# Patient Record
Sex: Male | Born: 1950 | Race: White | Hispanic: No | Marital: Married | Smoking: Never smoker
Health system: Southern US, Community
[De-identification: ages and names within clinical notes are randomized; demographics above are authoritative.]

## PROBLEM LIST (undated history)

## (undated) DIAGNOSIS — E785 Hyperlipidemia, unspecified: Secondary | ICD-10-CM

## (undated) DIAGNOSIS — N400 Enlarged prostate without lower urinary tract symptoms: Secondary | ICD-10-CM

## (undated) DIAGNOSIS — G629 Polyneuropathy, unspecified: Secondary | ICD-10-CM

## (undated) HISTORY — DX: Polyneuropathy, unspecified: G62.9

## (undated) HISTORY — DX: Benign prostatic hyperplasia without lower urinary tract symptoms: N40.0

## (undated) HISTORY — DX: Hyperlipidemia, unspecified: E78.5

## (undated) HISTORY — PX: ROTATOR CUFF REPAIR: SHX139

## (undated) HISTORY — PX: TONSILLECTOMY: SUR1361

---

## 2014-07-30 ENCOUNTER — Ambulatory Visit: Admit: 2014-07-30 | Discharge: 2014-07-30 | Payer: MEDICARE | Attending: Urology | Primary: General Practice

## 2014-07-30 DIAGNOSIS — N289 Disorder of kidney and ureter, unspecified: Secondary | ICD-10-CM

## 2014-07-30 LAB — AMB POC URINALYSIS DIP STICK AUTO W/O MICRO
Blood (UA POC): NEGATIVE
Glucose (UA POC): NEGATIVE
Leukocyte esterase (UA POC): NEGATIVE
Nitrites (UA POC): NEGATIVE
Specific gravity (UA POC): 1.03 (ref 1.001–1.035)
Urobilinogen (UA POC): 4 (ref 0.2–1)
pH (UA POC): 5.5 (ref 4.6–8.0)

## 2014-07-30 NOTE — Progress Notes (Signed)
ASSESSMENT:     ICD-10-CM ICD-9-CM    1. Renal lesion N28.9 593.9 AMB POC URINALYSIS DIP STICK AUTO W/O MICRO      CT ABD W WO CONT      1. Patient is a 64 y.o. Caucasian male with 33mm exophytic isodense right renal lesion, potential hemorrhagic cyst or solid mass on CT A/P 07/20/14  2. Transaminitis  3. Hepatic steatosis    PLAN:    CT images reviewed and report given to patient   Primary recommendation is for active surveillance  Instructed patient to limit use of NSAIDS  Recommend staying well hydrated  F/u in 4 months with imaging prior  CT ABD WWO ordered    DISCUSSION:   A solid renal mass raises the suspicion of primary renal malignancy. We discussed this in detail and in regards to the spectrum of renal masses which includes cysts (pure cysts are considered benign), solid masses and everything in between. The risk of metastasis increases as the size of solid renal mass increases. In general, it is believed that the risk of metastasis for renal masses less than 3-4 cm is small (up to approximately 5%) based mainly on large retrospective studies. In some cases and especially in patients of older age and multiple comorbidities a surveillance approach may be appropriate. The treatment of solid renal masses includes: surveillance, cryoablation (percutaneous and laparoscopic) in addition to partial and complete nephrectomy (each with option of laparoscopic, robotic and open depending on appropriateness). Furthermore, nephrectomy appears to be an independent risk factor for the development of chronic kidney disease suggesting that nephron sparing approaches should be implored whenever feasible. We reviewed these options in context of the patients current situation as well as the pros and cons of each.    For cystic renal masses, we reviewed the Bosniak classification and discussed that Bosniak 3 lesions harbor a 50% chance of malignancy whereas  Bosniak 4 cysts have a solid and 90-95% are malignant in nature.      Chief Complaint   Patient presents with   ??? Other     renal lesion       HISTORY OF PRESENT ILLNESS:  Gary Keith is a 64 y.o. Caucasian male who presents today for consultation for a renal mass and has been referred by Dr. Michae Kava.   Patient presented today reporting he was taking Oxycodone for 2 years and decided to come off it. He went to PCP and was switched to Tramadol. Patient reports taking Acetaminophen 800mg .   Patient presented to ED for elevated enzymes per his PCP. Presented to ED on 07/20/14 c/o RUQ pain that radiated to the back and up and down right lateral side    Patients presents today c/o right sided back pain.   He is actually not sure of how long the pain has been there, believes his chronic pain medication may have masked the pain for some years.     CT Abd Pelv 07/20/14 demonstrated no acute radiographic abnormality to explain the patient's abdominal pain. Hepatic steatosis. 9 mm exophytic isodense right renal lesion, potential hemorrhagic cyst or solid mass.     Patient denies nausea and vomiting.  Patient reports some constipation.   Patient denies anorexia or decreased appetite  Patient has been actively trying to lose weight has dropped approx 10lbs in the last few months    Patient denies any urinary difficulties.  He specifically denies gross hematuria, dysuria.     Patient has h/o diverticulitis, last colonoscopy  was 10 years ago, has had no major issues since then  Patient denies drinking alcohol and reports a negative smoking history.     Review of Systems  Constitutional: Fever: No  Skin: Rash: No  HEENT: Hearing difficulty: No  Eyes: Blurred vision: No  Cardiovascular: Chest pain: No  Respiratory: Shortness of breath: No  Gastrointestinal: Nausea/vomiting: No  Musculoskeletal: Back pain: No  Neurological: Weakness: No  Psychological: Memory loss: No  Comments/additional findings:      History reviewed. No pertinent past medical history.    History reviewed. No pertinent past surgical history.    History   Substance Use Topics   ??? Smoking status: Never Smoker    ??? Smokeless tobacco: Not on file   ??? Alcohol Use: No       No Known Allergies    History reviewed. No pertinent family history.    Current Outpatient Prescriptions   Medication Sig Dispense Refill   ??? TRAMADOL HCL (TRAMADOL PO) Take  by mouth.     ??? TIZANIDINE HCL (TIZANIDINE PO) Take  by mouth.     ??? citalopram (CELEXA) 20 mg tablet Take  by mouth daily.     ??? gabapentin (NEURONTIN) 600 mg tablet Take  by mouth three (3) times daily.     ??? DIPHENHYDRAMINE HCL (BENADRYL ALLERGY PO) Take  by mouth.     ??? tamsulosin (FLOMAX) 0.4 mg capsule Take 0.4 mg by mouth daily.           PHYSICAL EXAMINATION:   BP 120/70 mmHg   Ht $R'5\' 6"'QV$  (1.676 m)   Wt 206 lb (93.441 kg)   BMI 33.27 kg/m2  Constitutional: WDWN, Pleasant and appropriate affect, No acute distress.    CV:  No peripheral swelling noted  Respiratory: No respiratory distress or difficulties  Abdomen:  No abdominal masses or tenderness. No CVA tenderness. No inguinal hernias noted.   GU Male:  Deferred  Skin: No jaundice.    Neuro/Psych:  Alert and oriented x 3, affect appropriate.   Lymphatic:   No enlarged inguinal lymph nodes.        REVIEW OF LABS AND IMAGING:    Results for orders placed or performed in visit on 07/30/14   AMB POC URINALYSIS DIP STICK AUTO W/O MICRO   Result Value Ref Range    Color (UA POC) Yellow     Clarity (UA POC) Clear     Glucose (UA POC) Negative Negative    Bilirubin (UA POC) 1+ Negative    Ketones (UA POC) Trace Negative    Specific gravity (UA POC) 1.030 1.001 - 1.035    Blood (UA POC) Negative Negative    pH (UA POC) 5.5 4.6 - 8.0    Protein (UA POC) Trace Negative mg/dL    Urobilinogen (UA POC) 4 mg/dL 0.2 - 1    Nitrites (UA POC) Negative Negative    Leukocyte esterase (UA POC) Negative Negative       Imaging Report Reviewed?  YES   Type:  CT scan   Images Reviewed?      YES      Type:   CT scan     Other Lab Data Reviewed?   YES    Urinalysis, CBC, BMP, Lipase, Hepatic function panel     CT ABD PELV 07/20/14  FINDINGS:  LOWER CHEST: There is a linear area of subsegmental atelectasis or scarring in the right lower lobe.  LIVER, BILIARY: There is diffuse decreased attenuation of  the hepatic parenchyma consistent with steatosis. No biliary dilation. Gallbladder is contracted.  PANCREAS: Normal.  SPLEEN: Normal.  ADRENALS: Normal.  KIDNEYS/URETERS/BLADDER: There is a 9 mm exophytic isodense lesion within the interpolar right kidney. No hydroureteronephrosis. Normal bladder.  PELVIC ORGANS: Unremarkable.  VASCULATURE: Unremarkable.  LYMPH NODES: No enlarged lymph nodes.  GASTROINTESTINAL TRACT: No bowel dilation or wall thickening. Appendix is not visualized. No secondary findings to suggest acute appendicitis.  BONES: No acute or aggressive osseous abnormalities identified. ??  OTHER: None.    IMPRESSION:  1. No acute radiographic abnormality to explain the patient's abdominal pain.  2. Hepatic steatosis.  3. 9 mm exophytic isodense right renal lesion, potential hemorrhagic cyst or solid mass. Consider routine outpatient abdominal CT, renal mass protocol, for initial evaluation.    BMP 07/20/14     Ref Range 07/20/14 ??6:20 PM     POTASSIUM 3.5-5.5 mmol/L 4.6   SODIUM 133-145 mmol/L 141   CHLORIDE 98-110 mmol/L 102   GLUCOSE 65-99 mg/dL 124 (H)   CALCIUM 8.4-10.4 mg/dL 8.6   BUN 6-22 mg/dL 11   CREATININE 0.8-1.6 mg/dL 0.9   CO2 20-32 mmol/L 28   eGFR African American >60.0  >60.0   eGFR Non African American >60.0  >60.0   ANION GAP mmol/L 10.8     Lipase 07/20/14     Ref Range 07/20/14 ??6:20 PM     LIPASE 7-60 U/L 29     Hepatic function panel 07/20/14     Ref Range 07/20/14 ??6:20 PM     ALBUMIN 3.5-5.0 g/dL 4.2   TOTAL PROTEIN 6.2-8.1 g/dL 6.9   GLOBULIN SERUM 2.0-4.0 g/dL 2.7   A/G RATIO 1.1-2.6 ratio 1.6   BILIRUBIN TOTAL 0.2-1.2 mg/dL 0.3    BILIRUBIN DIRECT 0.0-0.3 mg/dL <0.2   SGOT (AST) 10-37 U/L 156 (H)   ALKALINE PHOSPHATASE 40-125 U/L 82   SGPT (ALT) 5-40 U/L 543 (H)     CBC 07/20/14     Ref Range 07/20/14 ??6:20 PM     WBC x 10*3 4.0-11.0 K/uL 8.6   RBC x 10^6 3.80-5.80 M/uL 4.80   HGB 13.1-17.2 g/dL 13.9   HCT 39.3-51.6 % 41.5   MCV 80-95 fL 87   MCH 26-34 pg 29   MCHC 32-36 g/dL 34   RDW 10.0-16.0 % 13.0   PLATELET 140-440 K/uL 240   MPV 6.0-10.8 fL 10.6   SEGMENTED NEUTROPHILS 40-75 % 49   LYMPHOCYTES 27-45 % 32   MONOCYTES 3-9 % 9   EOSINOPHIL 0-6 % 9 (H)   BASOPHILS 0-2 % 1   ABSOLUTE NEUTROPHILS 1.8-7.7 K/uL 4.3   ABSOLUTE LYMPHOCYTES 1.0-4.8 K/uL 2.8   ABSOLUTE MONOCYTE COUNT 0.1-0.9 K/uL 0.8   ABSOLUTE EOSINOPHIL 0.0-0.5 K/uL 0.8 (H)   ABSOLUTE BASOPHIL COUNT 0.0-0.2 K/uL 0.1           A copy of today's office visit with all pertinent imaging results and labs were sent to the referring physician, Dr. Amanda Pea, MD       Medical documentation is provided with the assistance of Nikki L. Yolanda Bonine, medical scribe for Shirlee Latch, MD

## 2014-08-05 NOTE — Telephone Encounter (Signed)
Pts wife called office stating that pt has a small kidney mass and LFT came back and she would like to discuss results with Dr Daphine DeutscherMartin.  Pts wife was informed that Dr Daphine DeutscherMartin is out of the office today, but a message would be sent to her.  Pts wife may be contacted at [phone].

## 2014-08-09 NOTE — Progress Notes (Signed)
Faxed patients orders to [facility] for scheduling in June prior to f/u scheduled for Friday, December 03, 2014 11:00 AM

## 2014-08-19 LAB — HEPATIC FUNCTION PANEL
A-G Ratio: 1.3 ratio (ref 1.1–2.6)
ALT (SGPT): 170 U/L — ABNORMAL HIGH (ref 5–40)
AST (SGOT): 47 U/L — ABNORMAL HIGH (ref 10–37)
Albumin: 4 g/dL (ref 3.5–5.0)
Alk. phosphatase: 93 U/L (ref 40–125)
Bilirubin, direct: 0.2 mg/dL (ref 0.0–0.3)
Bilirubin, total: 1 mg/dL (ref 0.2–1.2)
Globulin: 3.1 g/dL (ref 2.0–4.0)
Protein, total: 7.1 g/dL (ref 6.2–8.1)

## 2014-09-15 ENCOUNTER — Encounter: Attending: Urology | Primary: General Practice

## 2014-09-17 ENCOUNTER — Ambulatory Visit: Admit: 2014-09-17 | Discharge: 2014-09-17 | Payer: MEDICARE | Attending: Urology | Primary: General Practice

## 2014-09-17 DIAGNOSIS — N289 Disorder of kidney and ureter, unspecified: Secondary | ICD-10-CM

## 2014-09-17 LAB — AMB POC URINALYSIS DIP STICK AUTO W/O MICRO
Bilirubin (UA POC): NEGATIVE
Blood (UA POC): NEGATIVE
Glucose (UA POC): NEGATIVE
Ketones (UA POC): NEGATIVE
Leukocyte esterase (UA POC): NEGATIVE
Nitrites (UA POC): NEGATIVE
Protein (UA POC): NEGATIVE mg/dL
Specific gravity (UA POC): 1.02 (ref 1.001–1.035)
Urobilinogen (UA POC): 0.2 (ref 0.2–1)
pH (UA POC): 6.5 (ref 4.6–8.0)

## 2014-09-17 MED ORDER — TADALAFIL 5 MG TABLET
5 mg | ORAL_TABLET | Freq: Every day | ORAL | Status: AC | PRN
Start: 2014-09-17 — End: ?

## 2014-09-17 NOTE — Progress Notes (Signed)
ASSESSMENT:     ICD-10-CM ICD-9-CM    1. Renal lesion N28.9 593.9 AMB POC URINALYSIS DIP STICK AUTO W/O MICRO   2. Benign non-nodular prostatic hyperplasia with lower urinary tract symptoms N40.1 600.91 tadalafil (CIALIS) 5 mg tablet   3. Nocturia R35.1 788.43 tadalafil (CIALIS) 5 mg tablet      1. Patient is a 64 y.o. Caucasian male with 59mm exophytic isodense right renal lesion, potential hemorrhagic cyst or solid mass on CT A/P 07/20/14  2. Transaminitis  3. Hepatic steatosis  4. BPH/Nocturia     PLAN:    Schedule for right robotic-assisted laparoscopic partial nephrectomy with intraoperative renal US  My primary recommendation is surveillance, patient wishes to have surgery   Discussed cryoablation in detail with patient  CT reviewed in detail with patient   Cialis 5mg  rx given for BPH sxs and failed flomax      DISCUSSION:   Pre-operative Counseling Note for Partial Nephrectomy:    Dorsie Burich and I discussed the risks of the proposed partial nephrectomy.  I indicated that the risks include but are not limited to:  Infection, hemorrhage, possible vascular injury to all or a segment of the non-cancerous portion of the kidney, ureteral injury, urine leak, urine fistula, kidney cancer recurrence, pneumothorax with possible need for chest tube, bowel injury, flank bulge, incisional hernia, deep vein thrombosis, pulmonary embolis.  Aris Everts asked and I answered questions relating to these risks, possible benefits and alternatives to treatment inlcuding no treatment.      All questions have been answered to their level of satisfaction and they have agreed to proceed.    Chief Complaint   Patient presents with   ??? Renal Mass     Right       HISTORY OF PRESENT ILLNESS:  Gary Keith is a 64 y.o. Caucasian male who presents today in follow up for a renal mass.   Patient initially presented reporting he was taking Oxycodone for 2 years  and decided to come off it. He went to PCP and was switched to Tramadol. Reported that he was currently taking Acetaminophen 800mg .   Patient presented to ED for elevated enzymes per his PCP.   Presented to ED on 07/20/14 c/o RUQ pain that radiated to the back and up and down right lateral side    Patients presents today to discuss surgical options for his renal mass. He does not wish to surveillance the mass.   CT Abd Pelv 07/20/14 demonstrated no acute radiographic abnormality to explain the patient's abdominal pain. Hepatic steatosis. 9 mm exophytic isodense right renal lesion, potential hemorrhagic cyst or solid mass.     Patient denies nausea, vomiting, chills or fevers.   Patient denies anorexia or decreased appetite    Patient reports nocturia x2-3.   He specifically denies gross hematuria, dysuria.   He is currently on Flomax 0.4mg  without much benefit.     Patient denies heart or lung issues. Denies breathing issues.   Reports ability to walk up a flight of stairs.     Patient reports appendix surgery as a baby.  Patient has h/o diverticulitis, last colonoscopy was 10 years ago, has had no major issues since then  Patient denies drinking alcohol and reports a negative smoking history.     Patient reports his daughter is being married in June and he would like to be ready for her wedding.     Review of Systems  Constitutional: Fever: No  Skin: Rash: No  HEENT: Hearing difficulty: No  Eyes: Blurred vision: No  Cardiovascular: Chest pain: No  Respiratory: Shortness of breath: No  Gastrointestinal: Nausea/vomiting: No  Musculoskeletal: Back pain: Yes  Neurological: Weakness: No  right side, constant but controlled with medications  Psychological: Memory loss: No  Comments/additional findings:     Past Medical History   Diagnosis Date   ??? Kidney disease        History reviewed. No pertinent past surgical history.    History   Substance Use Topics   ??? Smoking status: Never Smoker     ??? Smokeless tobacco: Not on file   ??? Alcohol Use: No       No Known Allergies    History reviewed. No pertinent family history.    Current Outpatient Prescriptions   Medication Sig Dispense Refill   ??? tadalafil (CIALIS) 5 mg tablet Take 1 Tab by mouth daily as needed. 30 Tab 11   ??? TRAMADOL HCL (TRAMADOL PO) Take  by mouth.     ??? citalopram (CELEXA) 20 mg tablet Take  by mouth daily.     ??? gabapentin (NEURONTIN) 600 mg tablet Take  by mouth three (3) times daily.     ??? DIPHENHYDRAMINE HCL (BENADRYL ALLERGY PO) Take  by mouth.     ??? tamsulosin (FLOMAX) 0.4 mg capsule Take 0.4 mg by mouth daily.           PHYSICAL EXAMINATION:   BP 122/68 mmHg   Ht $R'5\' 6"'KI$  (1.676 m)   Wt 206 lb (93.441 kg)   BMI 33.27 kg/m2  Constitutional: WDWN, Pleasant and appropriate affect, No acute distress.    CV:  No peripheral swelling noted, RRR  Respiratory: No respiratory distress or difficulties, CTA B  Abdomen:  No abdominal masses or tenderness. No CVA tenderness. No inguinal hernias noted.   GU Male:  Deferred  Skin: No jaundice.    Neuro/Psych:  Alert and oriented x 3, affect appropriate.   Lymphatic:   No enlarged inguinal lymph nodes.        REVIEW OF LABS AND IMAGING:    Results for orders placed or performed in visit on 09/17/14   AMB POC URINALYSIS DIP STICK AUTO W/O MICRO   Result Value Ref Range    Color (UA POC) Yellow     Clarity (UA POC) Clear     Glucose (UA POC) Negative Negative    Bilirubin (UA POC) Negative Negative    Ketones (UA POC) Negative Negative    Specific gravity (UA POC) 1.020 1.001 - 1.035    Blood (UA POC) Negative Negative    pH (UA POC) 6.5 4.6 - 8.0    Protein (UA POC) Negative Negative mg/dL    Urobilinogen (UA POC) 0.2 mg/dL 0.2 - 1    Nitrites (UA POC) Negative Negative    Leukocyte esterase (UA POC) Negative Negative       Imaging Report Reviewed?  YES   Type:  CT scan  Images Reviewed?      YES      Type:   CT scan     Other Lab Data Reviewed?   YES    Urinalysis    CT ABD PELV 07/20/14  FINDINGS:   LOWER CHEST: There is a linear area of subsegmental atelectasis or scarring in the right lower lobe.  LIVER, BILIARY: There is diffuse decreased attenuation of the hepatic parenchyma consistent with steatosis. No biliary dilation. Gallbladder is contracted.  PANCREAS: Normal.  SPLEEN: Normal.  ADRENALS: Normal.  KIDNEYS/URETERS/BLADDER: There is a 9 mm exophytic isodense lesion within the interpolar right kidney. No hydroureteronephrosis. Normal bladder.  PELVIC ORGANS: Unremarkable.  VASCULATURE: Unremarkable.  LYMPH NODES: No enlarged lymph nodes.  GASTROINTESTINAL TRACT: No bowel dilation or wall thickening. Appendix is not visualized. No secondary findings to suggest acute appendicitis.  BONES: No acute or aggressive osseous abnormalities identified. ??  OTHER: None.    IMPRESSION:  1. No acute radiographic abnormality to explain the patient's abdominal pain.  2. Hepatic steatosis.  3. 9 mm exophytic isodense right renal lesion, potential hemorrhagic cyst or solid mass. Consider routine outpatient abdominal CT, renal mass protocol, for initial evaluation.    BMP 07/20/14     Ref Range 07/20/14 ??6:20 PM     POTASSIUM 3.5-5.5 mmol/L 4.6   SODIUM 133-145 mmol/L 141   CHLORIDE 98-110 mmol/L 102   GLUCOSE 65-99 mg/dL 254 (H)   CALCIUM 9.8-26.4 mg/dL 8.6   BUN 1-58 mg/dL 11   CREATININE 3.0-9.4 mg/dL 0.9   CO2 07-68 mmol/L 28   eGFR African American >60.0  >60.0   eGFR Non African American >60.0  >60.0   ANION GAP mmol/L 10.8     Lipase 07/20/14     Ref Range 07/20/14 ??6:20 PM     LIPASE 7-60 U/L 29     Hepatic function panel 07/20/14     Ref Range 07/20/14 ??6:20 PM     ALBUMIN 3.5-5.0 g/dL 4.2   TOTAL PROTEIN 0.8-8.1 g/dL 6.9   GLOBULIN SERUM 1.0-3.1 g/dL 2.7   A/G RATIO 5.9-4.5 ratio 1.6   BILIRUBIN TOTAL 0.2-1.2 mg/dL 0.3   BILIRUBIN DIRECT 0.0-0.3 mg/dL <8.5   SGOT (AST) 92-92 U/L 156 (H)   ALKALINE PHOSPHATASE 40-125 U/L 82   SGPT (ALT) 5-40 U/L 543 (H)     CBC 07/20/14     Ref Range 07/20/14 ??6:20 PM      WBC x 10*3 4.0-11.0 K/uL 8.6   RBC x 10^6 3.80-5.80 M/uL 4.80   HGB 13.1-17.2 g/dL 44.6   HCT 28.6-38.1 % 41.5   MCV 80-95 fL 87   MCH 26-34 pg 29   MCHC 32-36 g/dL 34   RDW 77.1-16.5 % 79.0   PLATELET 140-440 K/uL 240   MPV 6.0-10.8 fL 10.6   SEGMENTED NEUTROPHILS 40-75 % 49   LYMPHOCYTES 27-45 % 32   MONOCYTES 3-9 % 9   EOSINOPHIL 0-6 % 9 (H)   BASOPHILS 0-2 % 1   ABSOLUTE NEUTROPHILS 1.8-7.7 K/uL 4.3   ABSOLUTE LYMPHOCYTES 1.0-4.8 K/uL 2.8   ABSOLUTE MONOCYTE COUNT 0.1-0.9 K/uL 0.8   ABSOLUTE EOSINOPHIL 0.0-0.5 K/uL 0.8 (H)   ABSOLUTE BASOPHIL COUNT 0.0-0.2 K/uL 0.1       A copy of today's office visit with all pertinent imaging results and labs were sent to the referring physician, Dr. Elisha Ponder, MD       Brandy Hale. Daphine Deutscher, M.D., [facility]   Urological Oncologist   [facility]   [address]   [place], [place] [address]   [phone]       Medical documentation is provided with the assistance of Nikki L. Bing Plume, medical scribe for Francella Solian, MD

## 2014-09-29 NOTE — Telephone Encounter (Addendum)
Mr. Gary Keith called with question regarding [facility]. He is questioning moving forward with cryoblation.  I informed him that Dr. Daphine DeutscherMartin is out of the office for the week but I would message her an find out what we need to do to set him up.    Per Dr. Daphine DeutscherMartin a referral has been ordered and faxed to Dr. Everardo BealsVinghan for Mr. Gary Keith to discuss cryoblation.  Patient surgery has been scheduled. Appts and post op appts have been cancelled. Next f/u is Friday, December 03, 2014 11:00 AM with CT scan prior.  Imaging has been faxed to [facility] for auth and scheduling in late may early June.

## 2014-10-01 NOTE — Addendum Note (Signed)
Addended by: Mady GemmaLOCKHART, Maleek Craver A on: 10/01/2014 03:50 PM      Modules accepted: Orders

## 2014-10-04 NOTE — Telephone Encounter (Signed)
Pt called to state that he would not be able to come in this week for his F/U w/ Dr. Lalla BrothersLambert. He would like to R/S.    Due to [employer] phone systems down, I was unable to R/S at time of call. [facility] to [facility]/S.

## 2014-10-05 NOTE — Telephone Encounter (Signed)
I spoke with Mr. Gary Keith.  He wanted to be sure his appointment was cancelled with Dr. Daphine DeutscherMartin for post op now that we have cancelled his surgery.  Pt asked why the surgery could not remain on.  I advised him at this time he is unsure if he wants to proceed and wants to see Dr. Everardo BealsVinghan for Cryoblation.  Referral has been sent to Dr. Everardo BealsVinghan for consultation.  I informed the patient after meeting with Dr. Everardo BealsVinghan he should contact us if he wishes to proceed with surgery.  As for now he is scheduled for f/u Friday, December 03, 2014 11:00 AM .

## 2014-10-25 ENCOUNTER — Encounter: Attending: Urology | Primary: General Practice

## 2014-12-03 ENCOUNTER — Encounter: Attending: Urology | Primary: General Practice

## 2017-03-26 DIAGNOSIS — G4733 Obstructive sleep apnea (adult) (pediatric): Secondary | ICD-10-CM | POA: Diagnosis not present

## 2017-04-10 ENCOUNTER — Encounter: Payer: Self-pay | Admitting: Family Medicine

## 2017-04-10 ENCOUNTER — Ambulatory Visit (INDEPENDENT_AMBULATORY_CARE_PROVIDER_SITE_OTHER): Payer: Medicare Other | Admitting: Family Medicine

## 2017-04-10 VITALS — BP 138/88 | HR 64 | Ht 66.0 in | Wt 207.0 lb

## 2017-04-10 DIAGNOSIS — G609 Hereditary and idiopathic neuropathy, unspecified: Secondary | ICD-10-CM

## 2017-04-10 DIAGNOSIS — E669 Obesity, unspecified: Secondary | ICD-10-CM

## 2017-04-10 DIAGNOSIS — F32A Depression, unspecified: Secondary | ICD-10-CM | POA: Insufficient documentation

## 2017-04-10 DIAGNOSIS — N401 Enlarged prostate with lower urinary tract symptoms: Secondary | ICD-10-CM

## 2017-04-10 DIAGNOSIS — R351 Nocturia: Secondary | ICD-10-CM

## 2017-04-10 DIAGNOSIS — E785 Hyperlipidemia, unspecified: Secondary | ICD-10-CM | POA: Diagnosis not present

## 2017-04-10 DIAGNOSIS — F331 Major depressive disorder, recurrent, moderate: Secondary | ICD-10-CM | POA: Diagnosis not present

## 2017-04-10 DIAGNOSIS — D224 Melanocytic nevi of scalp and neck: Secondary | ICD-10-CM

## 2017-04-10 DIAGNOSIS — Q6102 Congenital multiple renal cysts: Secondary | ICD-10-CM

## 2017-04-10 DIAGNOSIS — Z23 Encounter for immunization: Secondary | ICD-10-CM

## 2017-04-10 DIAGNOSIS — F329 Major depressive disorder, single episode, unspecified: Secondary | ICD-10-CM | POA: Insufficient documentation

## 2017-04-10 MED ORDER — CITALOPRAM HYDROBROMIDE 10 MG PO TABS
10.0000 mg | ORAL_TABLET | Freq: Every day | ORAL | 2 refills | Status: DC
Start: 2017-04-10 — End: 2017-05-08

## 2017-04-10 NOTE — Progress Notes (Signed)
Date:  04/10/2017   Name:  Eddie Patterson   DOB:  [birth year]   MRN:  [id number]  PCP:  Adline Potter, MD    Chief Complaint: Establish Care   History of Present Illness:  This is a 66 y.o. male seen for initial visit, recently moved here from [place]. Hx R renal cysts, nephrectomy considered in past. 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 Takes gabapentin for idiopathic neuropathy, works well. HLD on Crestor x 1 year. On Celexa in past for "sweats," admits depression, would like to restart. Lesion on scalp apex for months, getting bigger. Father died suicide, mother died lung ca in 70s, brother died pancreatic ca in 18s, sister with breast cancer. Tet imm 2017, no zoster imm, colonoscopy in August, polyp removed.  Review of Systems:  Review of Systems  Constitutional: Negative for chills and fever.  HENT: Negative for ear pain, sinus pain and trouble swallowing.   Eyes: Negative for pain.  Respiratory: Negative for cough and shortness of breath.   Cardiovascular: Negative for chest pain and leg swelling.  Gastrointestinal: Negative for abdominal pain, constipation and diarrhea.  Genitourinary: Negative for difficulty urinating.  Musculoskeletal: Negative for joint swelling.  Neurological: Negative for syncope and light-headedness.  Hematological: Negative for adenopathy.    Patient Active Problem List   Diagnosis Date Noted  . Obesity (BMI 30-39.9) 04/10/2017  . Hyperlipidemia 04/10/2017  . Depression 04/10/2017  . Multiple renal cysts 04/10/2017    Prior to Admission medications   Medication Sig Start Date End Date Taking? Authorizing Provider  gabapentin (NEURONTIN) 600 MG tablet Take 900 mg by mouth 2 (two) times daily.   Yes [provider]  rosuvastatin (CRESTOR) 5 MG tablet Take 5 mg by mouth daily.   Yes [provider]  tamsulosin (FLOMAX) 0.4 MG CAPS capsule Take 0.8 mg by mouth daily.   Yes [provider]  citalopram (CELEXA)  10 MG tablet Take 1 tablet (10 mg total) by mouth daily. 04/10/17   Adline Potter, MD    No Known Allergies  Past Surgical History:  Procedure Laterality Date  . ROTATOR CUFF REPAIR Left   . TONSILLECTOMY      Social History  Substance Use Topics  . Smoking status: Never Smoker  . Smokeless tobacco: Never Used  . Alcohol use Yes    Family History  Problem Relation Age of Onset  . Cancer Mother   . Diabetes Brother     Medication list has been reviewed and updated.  Physical Examination: BP 138/88   Pulse 64   Ht 5\' 6"  (1.676 m)   Wt 207 lb (93.9 kg)   BMI 33.41 kg/m   Physical Exam  Constitutional: He is oriented to person, place, and time. He appears well-developed and well-nourished.  HENT:  Head: Normocephalic and atraumatic.  Right Ear: External ear normal.  Left Ear: External ear normal.  Nose: Nose normal.  Mouth/Throat: Oropharynx is clear and moist.  TMs clear  Eyes: Pupils are equal, round, and reactive to light. Conjunctivae and EOM are normal.  Neck: Neck supple. No thyromegaly present.  Cardiovascular: Normal rate, regular rhythm, normal heart sounds and intact distal pulses.   Pulmonary/Chest: Effort normal and breath sounds normal.  Abdominal: Soft. He exhibits no distension and no mass. There is no tenderness.  Musculoskeletal: He exhibits no edema.  Lymphadenopathy:    He has no cervical adenopathy.  Neurological: He is alert and oriented to person, place, and time. Coordination normal.  Romberg neg,  gait normal  Skin: Skin is warm and dry.  Irregular nevus scalp apex  Psychiatric: He has a normal mood and affect. His behavior is normal.  Nursing note and vitals reviewed.   Assessment and Plan:  1. Moderate episode of recurrent major depressive disorder (HCC) Restart Celexa 10 mg daily  2. BPH associated with nocturia Increase Flomax to 0.8 mg qhs - Ambulatory referral to Urology  3. Peripheral neuropathy, idiopathic Cont  gabapentin - B12  4. Hyperlipidemia, unspecified hyperlipidemia type On Crestor, unclear indication - Lipid Profile  5. Multiple renal cysts - Ambulatory referral to Urology  6. Atypical nevus of scalp - Ambulatory referral to Dermatology  7. Obesity (BMI 30-39.9) - Comprehensive Metabolic Panel (CMET) - CBC - TSH  8. Need for influenza vaccination - Flu vaccine HIGH DOSE PF (Fluzone High dose)  Return in about 4 weeks (around 05/08/2017).   45 mins spent with pt over half in counseling  Satira Anis. [facility]  04/10/2017

## 2017-04-11 ENCOUNTER — Other Ambulatory Visit: Payer: Self-pay | Admitting: Family Medicine

## 2017-04-11 LAB — COMPREHENSIVE METABOLIC PANEL
A/G RATIO: 2.2 (ref 1.2–2.2)
ALBUMIN: 5 g/dL — AB (ref 3.6–4.8)
ALK PHOS: 66 IU/L (ref 39–117)
ALT: 25 IU/L (ref 0–44)
AST: 20 IU/L (ref 0–40)
BILIRUBIN TOTAL: 0.5 mg/dL (ref 0.0–1.2)
BUN / CREAT RATIO: 16 (ref 10–24)
BUN: 13 mg/dL (ref 8–27)
CO2: 24 mmol/L (ref 20–29)
CREATININE: 0.81 mg/dL (ref 0.76–1.27)
Calcium: 9.5 mg/dL (ref 8.6–10.2)
Chloride: 101 mmol/L (ref 96–106)
GFR calc Af Amer: 109 mL/min/{1.73_m2} (ref >59)
GFR calc non Af Amer: 94 mL/min/{1.73_m2} (ref >59)
GLOBULIN, TOTAL: 2.3 g/dL (ref 1.5–4.5)
Glucose: 93 mg/dL (ref 65–99)
POTASSIUM: 4.3 mmol/L (ref 3.5–5.2)
SODIUM: 141 mmol/L (ref 134–144)
Total Protein: 7.3 g/dL (ref 6.0–8.5)

## 2017-04-11 LAB — CBC
Hematocrit: 45.4 % (ref 37.5–51.0)
Hemoglobin: 15.4 g/dL (ref 13.0–17.7)
MCH: 29.3 pg (ref 26.6–33.0)
MCHC: 33.9 g/dL (ref 31.5–35.7)
MCV: 87 fL (ref 79–97)
PLATELETS: 237 10*3/uL (ref 150–379)
RBC: 5.25 x10E6/uL (ref 4.14–5.80)
RDW: 13.8 % (ref 12.3–15.4)
WBC: 7.2 10*3/uL (ref 3.4–10.8)

## 2017-04-11 LAB — LIPID PANEL
CHOL/HDL RATIO: 4 ratio (ref 0.0–5.0)
Cholesterol, Total: 179 mg/dL (ref 100–199)
HDL: 45 mg/dL (ref >39)
LDL Calculated: 101 mg/dL — ABNORMAL HIGH (ref 0–99)
TRIGLYCERIDES: 165 mg/dL — AB (ref 0–149)
VLDL Cholesterol Cal: 33 mg/dL (ref 5–40)

## 2017-04-11 LAB — VITAMIN B12: VITAMIN B 12: 330 pg/mL (ref 232–1245)

## 2017-04-11 LAB — TSH: TSH: 1.56 u[IU]/mL (ref 0.450–4.500)

## 2017-04-11 MED ORDER — B-12 500 MCG PO TABS: 1.0000 | ORAL_TABLET | Freq: Every day | ORAL | Status: AC

## 2017-04-15 DIAGNOSIS — L570 Actinic keratosis: Secondary | ICD-10-CM | POA: Diagnosis not present

## 2017-04-26 DIAGNOSIS — G4733 Obstructive sleep apnea (adult) (pediatric): Secondary | ICD-10-CM | POA: Diagnosis not present

## 2017-05-02 DIAGNOSIS — H524 Presbyopia: Secondary | ICD-10-CM | POA: Diagnosis not present

## 2017-05-06 DIAGNOSIS — G4733 Obstructive sleep apnea (adult) (pediatric): Secondary | ICD-10-CM | POA: Diagnosis not present

## 2017-05-08 ENCOUNTER — Encounter: Payer: Self-pay | Admitting: Family Medicine

## 2017-05-08 ENCOUNTER — Ambulatory Visit (INDEPENDENT_AMBULATORY_CARE_PROVIDER_SITE_OTHER): Payer: Medicare Other | Admitting: Family Medicine

## 2017-05-08 VITALS — BP 120/78 | HR 68 | Resp 16 | Ht 66.0 in | Wt 204.0 lb

## 2017-05-08 DIAGNOSIS — E538 Deficiency of other specified B group vitamins: Secondary | ICD-10-CM | POA: Diagnosis not present

## 2017-05-08 DIAGNOSIS — F331 Major depressive disorder, recurrent, moderate: Secondary | ICD-10-CM | POA: Diagnosis not present

## 2017-05-08 DIAGNOSIS — N401 Enlarged prostate with lower urinary tract symptoms: Secondary | ICD-10-CM

## 2017-05-08 DIAGNOSIS — D224 Melanocytic nevi of scalp and neck: Secondary | ICD-10-CM | POA: Diagnosis not present

## 2017-05-08 DIAGNOSIS — Q6102 Congenital multiple renal cysts: Secondary | ICD-10-CM

## 2017-05-08 DIAGNOSIS — G609 Hereditary and idiopathic neuropathy, unspecified: Secondary | ICD-10-CM

## 2017-05-08 DIAGNOSIS — E669 Obesity, unspecified: Secondary | ICD-10-CM

## 2017-05-08 DIAGNOSIS — R351 Nocturia: Secondary | ICD-10-CM | POA: Diagnosis not present

## 2017-05-08 MED ORDER — CITALOPRAM HYDROBROMIDE 20 MG PO TABS: 20.0000 mg | ORAL_TABLET | Freq: Every day | ORAL | 3 refills | Status: DC

## 2017-05-08 MED ORDER — GABAPENTIN 600 MG PO TABS: 900.0000 mg | ORAL_TABLET | Freq: Two times a day (BID) | ORAL | 3 refills | Status: DC

## 2017-05-08 MED ORDER — TAMSULOSIN HCL 0.4 MG PO CAPS: 0.8000 mg | ORAL_CAPSULE | Freq: Every day | ORAL | 3 refills | Status: DC

## 2017-05-08 NOTE — Progress Notes (Signed)
Date:  05/08/2017   Name:  Eddie Patterson   DOB:  09-10-1950   MRN:  [id number]  PCP:  Adline Potter, MD    Chief Complaint: Depression (4 week f/u )   History of Present Illness:  This is a 66 y.o. male seen for one month f/u from initial visit. Feels Celexa helping mood and increased Flomax helping nocturia. PN stable on gabapentin. Crestor stopped due to no clear indication. TO see urology next week for renal cysts, saw derm for scalp lesion. Labs showed low normal B12, supplement started given PN. Weight down 3#, exercising at gym 3d/wk.  Review of Systems:  Review of Systems  Constitutional: Negative for chills and fever.  Respiratory: Negative for cough and shortness of breath.   Cardiovascular: Negative for chest pain and leg swelling.  Genitourinary: Negative for difficulty urinating.  Neurological: Negative for syncope and light-headedness.    Patient Active Problem List   Diagnosis Date Noted  . B12 deficiency 05/08/2017  . Obesity (BMI 30-39.9) 04/10/2017  . Hyperlipidemia 04/10/2017  . Depression 04/10/2017  . Multiple renal cysts 04/10/2017  . BPH associated with nocturia 04/10/2017  . Peripheral neuropathy, idiopathic 04/10/2017  . Atypical nevus of scalp 04/10/2017    Prior to Admission medications   Medication Sig Start Date End Date Taking? Authorizing Provider  citalopram (CELEXA) 20 MG tablet Take 1 tablet (20 mg total) daily by mouth. 05/08/17  Yes Jahayra Mazo, Gwyndolyn Saxon, MD  Cyanocobalamin (B-12) 500 MCG TABS Take 1 tablet by mouth daily. 04/11/17  Yes Avenell Sellers, Gwyndolyn Saxon, MD  gabapentin (NEURONTIN) 600 MG tablet Take 1.5 tablets (900 mg total) 2 (two) times daily by mouth. 05/08/17  Yes Roniya Tetro, Gwyndolyn Saxon, MD  tadalafil (CIALIS) 5 MG tablet Take by mouth. 09/17/14  Yes [provider]  tamsulosin (FLOMAX) 0.4 MG CAPS capsule Take 2 capsules (0.8 mg total) daily by mouth. 05/08/17  Yes Bellamia Ferch, Gwyndolyn Saxon, MD    No Known Allergies  Past Surgical History:   Procedure Laterality Date  . ROTATOR CUFF REPAIR Left   . TONSILLECTOMY      Social History   Tobacco Use  . Smoking status: Never Smoker  . Smokeless tobacco: Never Used  Substance Use Topics  . Alcohol use: Yes  . Drug use: No    Family History  Problem Relation Age of Onset  . Cancer Mother   . Diabetes Brother     Medication list has been reviewed and updated.  Physical Examination: BP 120/78   Pulse 68   Resp 16   Ht 5\' 6"  (1.676 m)   Wt 204 lb (92.5 kg)   SpO2 98%   BMI 32.93 kg/m   Physical Exam  Constitutional: He appears well-developed and well-nourished.  Cardiovascular: Normal rate, regular rhythm and normal heart sounds.  Pulmonary/Chest: Effort normal and breath sounds normal.  Musculoskeletal: He exhibits no edema.  Neurological: He is alert.  Skin: Skin is warm and dry.  Psychiatric: He has a normal mood and affect. His behavior is normal.  Nursing note and vitals reviewed.   Assessment and Plan:  1. Moderate episode of recurrent major depressive disorder (HCC) Improved on Celexa  2. Peripheral neuropathy, idiopathic Stable on gabapentin  3. B12 deficiency On supplement - B12  4. BPH associated with nocturia Improved on increased Flomax  5. Multiple renal cysts To see urology next week  6. Atypical nevus of scalp S/p derm eval  7. Obesity (BMI 30-39.9) Weight down 3#, encouraged exercise at least150 mins/wk, further weight  loss  Return in about 3 months (around 08/08/2017).  Satira Anis. [facility]  05/08/2017

## 2017-05-09 LAB — VITAMIN B12: VITAMIN B 12: 425 pg/mL (ref 232–1245)

## 2017-05-23 ENCOUNTER — Other Ambulatory Visit: Payer: Self-pay

## 2017-05-23 DIAGNOSIS — N400 Enlarged prostate without lower urinary tract symptoms: Secondary | ICD-10-CM

## 2017-05-24 ENCOUNTER — Ambulatory Visit: Payer: Medicare Other | Admitting: Urology

## 2017-05-26 DIAGNOSIS — G4733 Obstructive sleep apnea (adult) (pediatric): Secondary | ICD-10-CM | POA: Diagnosis not present

## 2017-06-04 ENCOUNTER — Encounter: Payer: Self-pay | Admitting: Family Medicine

## 2017-06-04 ENCOUNTER — Ambulatory Visit (INDEPENDENT_AMBULATORY_CARE_PROVIDER_SITE_OTHER): Payer: Medicare Other | Admitting: Family Medicine

## 2017-06-04 ENCOUNTER — Other Ambulatory Visit: Payer: Self-pay | Admitting: Family Medicine

## 2017-06-04 VITALS — BP 118/76 | HR 78 | Resp 16 | Ht 65.0 in | Wt 206.0 lb

## 2017-06-04 DIAGNOSIS — N401 Enlarged prostate with lower urinary tract symptoms: Secondary | ICD-10-CM | POA: Diagnosis not present

## 2017-06-04 DIAGNOSIS — F331 Major depressive disorder, recurrent, moderate: Secondary | ICD-10-CM | POA: Diagnosis not present

## 2017-06-04 DIAGNOSIS — E538 Deficiency of other specified B group vitamins: Secondary | ICD-10-CM | POA: Diagnosis not present

## 2017-06-04 DIAGNOSIS — R351 Nocturia: Secondary | ICD-10-CM

## 2017-06-04 DIAGNOSIS — G609 Hereditary and idiopathic neuropathy, unspecified: Secondary | ICD-10-CM | POA: Diagnosis not present

## 2017-06-04 DIAGNOSIS — R109 Unspecified abdominal pain: Secondary | ICD-10-CM

## 2017-06-04 LAB — POCT URINALYSIS DIPSTICK
BILIRUBIN UA: NEGATIVE
Glucose, UA: NEGATIVE
Ketones, UA: 5
LEUKOCYTES UA: NEGATIVE
NITRITE UA: NEGATIVE
RBC UA: NEGATIVE
SPEC GRAV UA: 1.02 (ref 1.010–1.025)
UROBILINOGEN UA: 1 U/dL
pH, UA: 7 (ref 5.0–8.0)

## 2017-06-04 MED ORDER — NAPROXEN 500 MG PO TABS: 500.0000 mg | ORAL_TABLET | Freq: Two times a day (BID) | ORAL | 0 refills | Status: DC

## 2017-06-04 NOTE — Progress Notes (Signed)
Date:  06/04/2017   Name:  Eddie Patterson   DOB:  Jun 16, 1951   MRN:  [id number]  PCP:  Adline Potter, MD    Chief Complaint: Flank Pain (Left side )   History of Present Illness:  This is a 66 y.o. male with 2d hx L side pain at lower costal margin, positional, getting worse, no known injury. Remains on Flomax for BPH, gaba for PN, Celexa for depression, all stable. Has had slight cough.  Review of Systems:  Review of Systems  Constitutional: Negative for chills and fever.  HENT: Negative for sore throat.   Respiratory: Negative for shortness of breath.   Cardiovascular: Negative for chest pain and leg swelling.  Gastrointestinal: Negative for abdominal pain, constipation, diarrhea, nausea and vomiting.  Genitourinary: Negative for dysuria and hematuria.  Neurological: Negative for syncope and light-headedness.    Patient Active Problem List   Diagnosis Date Noted  . B12 deficiency 05/08/2017  . Obesity (BMI 30-39.9) 04/10/2017  . Hyperlipidemia 04/10/2017  . Depression 04/10/2017  . Multiple renal cysts 04/10/2017  . BPH associated with nocturia 04/10/2017  . Peripheral neuropathy, idiopathic 04/10/2017  . Atypical nevus of scalp 04/10/2017    Prior to Admission medications   Medication Sig Start Date End Date Taking? Authorizing Provider  citalopram (CELEXA) 20 MG tablet Take 1 tablet (20 mg total) daily by mouth. 05/08/17  Yes Kazumi Lachney, Gwyndolyn Saxon, MD  Cyanocobalamin (B-12) 500 MCG TABS Take 1 tablet by mouth daily. 04/11/17  Yes Navpreet Szczygiel, Gwyndolyn Saxon, MD  gabapentin (NEURONTIN) 600 MG tablet Take 1.5 tablets (900 mg total) 2 (two) times daily by mouth. 05/08/17  Yes Sukaina Toothaker, Gwyndolyn Saxon, MD  tadalafil (CIALIS) 5 MG tablet Take by mouth. 09/17/14  Yes [provider]  tamsulosin (FLOMAX) 0.4 MG CAPS capsule Take 2 capsules (0.8 mg total) daily by mouth. 05/08/17  Yes Makinsley Schiavi, Gwyndolyn Saxon, MD  naproxen (NAPROSYN) 500 MG tablet Take 1 tablet (500 mg total) by mouth 2 (two) times  daily with a meal. 06/04/17   Adline Potter, MD    No Known Allergies  Past Surgical History:  Procedure Laterality Date  . ROTATOR CUFF REPAIR Left   . TONSILLECTOMY      Social History   Tobacco Use  . Smoking status: Never Smoker  . Smokeless tobacco: Never Used  Substance Use Topics  . Alcohol use: Yes  . Drug use: No    Family History  Problem Relation Age of Onset  . Cancer Mother   . Diabetes Brother     Medication list has been reviewed and updated.  Physical Examination: BP 118/76   Pulse 78   Resp 16   Ht 5\' 5"  (1.651 m)   Wt 206 lb (93.4 kg)   SpO2 98%   BMI 34.28 kg/m   Physical Exam  Constitutional: He is oriented to person, place, and time. He appears well-developed and well-nourished.  Cardiovascular: Normal rate, regular rhythm and normal heart sounds.  Pulmonary/Chest: Effort normal and breath sounds normal.  Abdominal: Soft. He exhibits no distension and no mass. There is no rebound and no guarding.  Mild LUQ tenderness  Musculoskeletal: He exhibits no edema.  Mild L lower costal margin tenderness  Neurological: He is alert and oriented to person, place, and time.  Skin: Skin is warm and dry.  Psychiatric: He has a normal mood and affect. His behavior is normal.  Nursing note and vitals reviewed.   Assessment and Plan:  1. Flank pain UA shows tr ket, tr  prot, 1+ uro only, suspect musculoskeletal, trial Naprosyn bid x 7d, push PO fluids, consider labs/US if sxs persist - POCT urinalysis dipstick  2. BPH associated with nocturia Cont Flomax  3. Moderate episode of recurrent major depressive disorder (HCC) Cont Celexa  4. Peripheral neuropathy, idiopathic Cont gabapentin  5. B12 deficiency Cont supplement  Return if symptoms worsen or fail to improve.  Satira Anis. [facility]  06/04/2017

## 2017-06-08 ENCOUNTER — Other Ambulatory Visit: Payer: Self-pay | Admitting: Family Medicine

## 2017-06-26 DIAGNOSIS — G4733 Obstructive sleep apnea (adult) (pediatric): Secondary | ICD-10-CM | POA: Diagnosis not present

## 2017-07-09 ENCOUNTER — Other Ambulatory Visit: Payer: Self-pay | Admitting: Family Medicine

## 2017-07-09 MED ORDER — CITALOPRAM HYDROBROMIDE 20 MG PO TABS: 20.0000 mg | ORAL_TABLET | Freq: Every day | ORAL | 3 refills | Status: DC

## 2017-07-11 ENCOUNTER — Ambulatory Visit (INDEPENDENT_AMBULATORY_CARE_PROVIDER_SITE_OTHER): Payer: Medicare Other | Admitting: Family Medicine

## 2017-07-11 ENCOUNTER — Other Ambulatory Visit: Payer: Self-pay

## 2017-07-11 ENCOUNTER — Encounter: Payer: Self-pay | Admitting: Family Medicine

## 2017-07-11 VITALS — BP 108/78 | HR 79 | Resp 16 | Ht 65.0 in | Wt 205.0 lb

## 2017-07-11 DIAGNOSIS — G609 Hereditary and idiopathic neuropathy, unspecified: Secondary | ICD-10-CM | POA: Diagnosis not present

## 2017-07-11 DIAGNOSIS — R351 Nocturia: Secondary | ICD-10-CM | POA: Diagnosis not present

## 2017-07-11 DIAGNOSIS — N452 Orchitis: Secondary | ICD-10-CM | POA: Diagnosis not present

## 2017-07-11 DIAGNOSIS — E669 Obesity, unspecified: Secondary | ICD-10-CM | POA: Diagnosis not present

## 2017-07-11 DIAGNOSIS — F331 Major depressive disorder, recurrent, moderate: Secondary | ICD-10-CM

## 2017-07-11 DIAGNOSIS — N401 Enlarged prostate with lower urinary tract symptoms: Secondary | ICD-10-CM | POA: Diagnosis not present

## 2017-07-11 DIAGNOSIS — E538 Deficiency of other specified B group vitamins: Secondary | ICD-10-CM | POA: Diagnosis not present

## 2017-07-11 MED ORDER — LEVOFLOXACIN 500 MG PO TABS: 500.0000 mg | ORAL_TABLET | Freq: Every day | ORAL | 0 refills | Status: DC

## 2017-07-11 NOTE — Progress Notes (Signed)
Date:  07/11/2017   Name:  Eddie Patterson   DOB:  [birth year]   MRN:  [id number]  PCP:  Adline Potter, MD    Chief Complaint: No chief complaint on file.   History of Present Illness:  This is a 67 y.o. male seen for one month f/u. C/o L testicular pain, told epididymitis in the past, resolved with abxs. BPH well controlled on Flomax, depression on Celexa, PN on gabapentin, B12 def on supplement.  Review of Systems:  Review of Systems  Constitutional: Negative for chills and fever.  Respiratory: Negative for cough and shortness of breath.   Cardiovascular: Negative for chest pain and leg swelling.  Genitourinary: Negative for difficulty urinating.  Neurological: Negative for syncope and light-headedness.    Patient Active Problem List   Diagnosis Date Noted  . B12 deficiency 05/08/2017  . Obesity (BMI 30-39.9) 04/10/2017  . Hyperlipidemia 04/10/2017  . Depression 04/10/2017  . Multiple renal cysts 04/10/2017  . BPH associated with nocturia 04/10/2017  . Peripheral neuropathy, idiopathic 04/10/2017  . Atypical nevus of scalp 04/10/2017    Prior to Admission medications   Medication Sig Start Date End Date Taking? Authorizing Provider  citalopram (CELEXA) 20 MG tablet Take 1 tablet (20 mg total) by mouth daily. 07/09/17  Yes Courtlyn Aki, Gwyndolyn Saxon, MD  Cyanocobalamin (B-12) 500 MCG TABS Take 1 tablet by mouth daily. 04/11/17  Yes Brittain Hosie, Gwyndolyn Saxon, MD  gabapentin (NEURONTIN) 600 MG tablet Take 1.5 tablets (900 mg total) 2 (two) times daily by mouth. 05/08/17  Yes Rufus Beske, Gwyndolyn Saxon, MD  tamsulosin (FLOMAX) 0.4 MG CAPS capsule Take 2 capsules (0.8 mg total) daily by mouth. 05/08/17  Yes Tyquon Near, Gwyndolyn Saxon, MD  levofloxacin (LEVAQUIN) 500 MG tablet Take 1 tablet (500 mg total) by mouth daily. 07/11/17   Adline Potter, MD    No Known Allergies  Past Surgical History:  Procedure Laterality Date  . ROTATOR CUFF REPAIR Left   . TONSILLECTOMY      Social History   Tobacco Use  . Smoking  status: Never Smoker  . Smokeless tobacco: Never Used  Substance Use Topics  . Alcohol use: Yes  . Drug use: No    Family History  Problem Relation Age of Onset  . Cancer Mother   . Diabetes Brother     Medication list has been reviewed and updated.  Physical Examination: BP 108/78   Pulse 79   Resp 16   Ht 5\' 5"  (1.651 m)   Wt 205 lb (93 kg)   SpO2 97%   BMI 34.11 kg/m   Physical Exam  Constitutional: He appears well-developed and well-nourished.  Cardiovascular: Normal rate, regular rhythm and normal heart sounds.  Pulmonary/Chest: Effort normal and breath sounds normal.  Genitourinary:  Genitourinary Comments: L testis enlarged and moderately tender, no epididymal tenderness R testis normal  Musculoskeletal: He exhibits no edema.  Neurological: He is alert.  Skin: Skin is warm and dry.  Psychiatric: He has a normal mood and affect. His behavior is normal.  Nursing note and vitals reviewed.   Assessment and Plan:  1. Orchitis Levaquin 500 mg daily x 10d, call if sxs worsen/persist  2. Moderate episode of recurrent major depressive disorder (HCC) Well controlled on Celexa  3. Peripheral neuropathy, idiopathic Well controlled on gabapentin  4. BPH associated with nocturia Well controlled on Flomax  5. B12 deficiency Well controlled on supplement  6. Obesity (BMI 30-39.9) Stable, exercise/weight loss discussed  Return in about 6 months (around 01/08/2018).  Satira Anis.  [facility]  07/11/2017

## 2017-07-27 DIAGNOSIS — G4733 Obstructive sleep apnea (adult) (pediatric): Secondary | ICD-10-CM | POA: Diagnosis not present

## 2017-08-12 ENCOUNTER — Ambulatory Visit (INDEPENDENT_AMBULATORY_CARE_PROVIDER_SITE_OTHER): Payer: Medicare Other | Admitting: Family Medicine

## 2017-08-12 ENCOUNTER — Encounter: Payer: Self-pay | Admitting: Family Medicine

## 2017-08-12 VITALS — BP 118/76 | HR 68 | Resp 16 | Ht 65.0 in | Wt 202.0 lb

## 2017-08-12 DIAGNOSIS — E538 Deficiency of other specified B group vitamins: Secondary | ICD-10-CM | POA: Diagnosis not present

## 2017-08-12 DIAGNOSIS — G609 Hereditary and idiopathic neuropathy, unspecified: Secondary | ICD-10-CM

## 2017-08-12 DIAGNOSIS — F331 Major depressive disorder, recurrent, moderate: Secondary | ICD-10-CM | POA: Diagnosis not present

## 2017-08-12 DIAGNOSIS — N452 Orchitis: Secondary | ICD-10-CM | POA: Diagnosis not present

## 2017-08-12 DIAGNOSIS — E669 Obesity, unspecified: Secondary | ICD-10-CM

## 2017-08-12 DIAGNOSIS — N401 Enlarged prostate with lower urinary tract symptoms: Secondary | ICD-10-CM

## 2017-08-12 DIAGNOSIS — R351 Nocturia: Secondary | ICD-10-CM | POA: Diagnosis not present

## 2017-08-12 MED ORDER — GABAPENTIN 600 MG PO TABS: 1200.0000 mg | ORAL_TABLET | Freq: Two times a day (BID) | ORAL | 3 refills | Status: DC

## 2017-08-12 NOTE — Progress Notes (Signed)
Date:  08/12/2017   Name:  Eddie Patterson   DOB:  12/23/1950   MRN:  [id number]  PCP:  Adline Potter, MD    Chief Complaint: orchitis   History of Present Illness:  This is a 67 y.o. male seen for one month f/u from orchitis. Sxs improved on Levaquin but not completely resolved. Decreased Flomax dose due to urgency but now having more nocturia, wants to see urologist, has referral from October. Depression stable on Celexa but neuropathy sxs worse. Weight down 3#, exercising more.  Review of Systems:  Review of Systems  Constitutional: Negative for chills and fever.  Respiratory: Negative for cough and shortness of breath.   Cardiovascular: Negative for chest pain and leg swelling.  Neurological: Negative for syncope and light-headedness.    Patient Active Problem List   Diagnosis Date Noted  . B12 deficiency 05/08/2017  . Obesity (BMI 30-39.9) 04/10/2017  . Hyperlipidemia 04/10/2017  . Depression 04/10/2017  . Multiple renal cysts 04/10/2017  . BPH associated with nocturia 04/10/2017  . Peripheral neuropathy, idiopathic 04/10/2017  . Atypical nevus of scalp 04/10/2017    Prior to Admission medications   Medication Sig Start Date End Date Taking? Authorizing Provider  citalopram (CELEXA) 20 MG tablet Take 1 tablet (20 mg total) by mouth daily. 07/09/17  Yes Amanee Iacovelli, Gwyndolyn Saxon, MD  Cyanocobalamin (B-12) 500 MCG TABS Take 1 tablet by mouth daily. 04/11/17  Yes Beila Purdie, Gwyndolyn Saxon, MD  gabapentin (NEURONTIN) 600 MG tablet Take 2 tablets (1,200 mg total) by mouth 2 (two) times daily. 08/12/17  Yes Jia Dottavio, Gwyndolyn Saxon, MD  tamsulosin (FLOMAX) 0.4 MG CAPS capsule Take 2 capsules (0.8 mg total) daily by mouth. 05/08/17  Yes Abiel Antrim, Gwyndolyn Saxon, MD    No Known Allergies  Past Surgical History:  Procedure Laterality Date  . ROTATOR CUFF REPAIR Left   . TONSILLECTOMY      Social History   Tobacco Use  . Smoking status: Never Smoker  . Smokeless tobacco: Never Used  Substance Use Topics  .  Alcohol use: Yes  . Drug use: No    Family History  Problem Relation Age of Onset  . Cancer Mother   . Diabetes Brother     Medication list has been reviewed and updated.  Physical Examination: BP 118/76   Pulse 68   Resp 16   Ht 5\' 5"  (1.651 m)   Wt 202 lb (91.6 kg)   SpO2 96%   BMI 33.61 kg/m   Physical Exam  Constitutional: He appears well-developed and well-nourished.  Cardiovascular: Normal rate, regular rhythm and normal heart sounds.  Pulmonary/Chest: Effort normal and breath sounds normal.  Genitourinary:  Genitourinary Comments: L testis mildly tender and enlarged compared to R, no mass appreciated  Musculoskeletal: He exhibits no edema.  Neurological: He is alert.  Skin: Skin is warm and dry.  Psychiatric: He has a normal mood and affect. His behavior is normal.  Nursing note and vitals reviewed.   Assessment and Plan:  1. Moderate episode of recurrent major depressive disorder (HCC) Stable on Celexa  2. Orchitis Improved but not resolved s/p Levaquin, urology to evaluate (has referral from October)  3. Peripheral neuropathy, idiopathic Marginal control, increase gabapentin to 600 mg bid, call if ineffective  4. BPH associated with nocturia Marginal control on Flomax, urology to evaluate  5. Obesity (BMI 30-39.9) Weight down 3#, encouraged continued exercise/weight loss  6. B12 deficiency Well controlled on supplement  Return in about 6 months (around 02/09/2018).  Satira Anis. Cipriana Biller,  [facility]  08/12/2017

## 2017-08-30 ENCOUNTER — Other Ambulatory Visit
Admission: RE | Admit: 2017-08-30 | Discharge: 2017-08-30 | Disposition: A | Discharge status: Home / Self Care | Payer: Medicare Other | Source: Ambulatory Visit | Attending: Urology | Admitting: Urology

## 2017-08-30 ENCOUNTER — Encounter: Payer: Self-pay | Admitting: Urology

## 2017-08-30 ENCOUNTER — Ambulatory Visit (INDEPENDENT_AMBULATORY_CARE_PROVIDER_SITE_OTHER): Payer: Medicare Other | Admitting: Urology

## 2017-08-30 ENCOUNTER — Telehealth: Payer: Self-pay | Admitting: Family Medicine

## 2017-08-30 VITALS — BP 125/69 | HR 90 | Ht 66.0 in | Wt 199.0 lb

## 2017-08-30 DIAGNOSIS — N2889 Other specified disorders of kidney and ureter: Secondary | ICD-10-CM | POA: Diagnosis not present

## 2017-08-30 DIAGNOSIS — N451 Epididymitis: Secondary | ICD-10-CM

## 2017-08-30 DIAGNOSIS — N5203 Combined arterial insufficiency and corporo-venous occlusive erectile dysfunction: Secondary | ICD-10-CM

## 2017-08-30 DIAGNOSIS — N401 Enlarged prostate with lower urinary tract symptoms: Secondary | ICD-10-CM | POA: Diagnosis not present

## 2017-08-30 DIAGNOSIS — N138 Other obstructive and reflux uropathy: Secondary | ICD-10-CM

## 2017-08-30 DIAGNOSIS — Z8639 Personal history of other endocrine, nutritional and metabolic disease: Secondary | ICD-10-CM | POA: Diagnosis not present

## 2017-08-30 LAB — URINALYSIS, COMPLETE (UACMP) WITH MICROSCOPIC
BILIRUBIN URINE: NEGATIVE
GLUCOSE, UA: NEGATIVE mg/dL
HGB URINE DIPSTICK: NEGATIVE
KETONES UR: NEGATIVE mg/dL
LEUKOCYTES UA: NEGATIVE
NITRITE: NEGATIVE
PH: 7.5 (ref 5.0–8.0)
PROTEIN: NEGATIVE mg/dL
Specific Gravity, Urine: 1.02 (ref 1.005–1.030)

## 2017-08-30 LAB — BLADDER SCAN AMB NON-IMAGING: SCAN RESULT: 28

## 2017-08-30 MED ORDER — FINASTERIDE 5 MG PO TABS
5.0000 mg | ORAL_TABLET | Freq: Every day | ORAL | 11 refills | Status: DC
Start: 2017-08-30 — End: 2018-02-19

## 2017-08-30 NOTE — Telephone Encounter (Signed)
Pt needs to have Pharmacy changed to [facility]

## 2017-08-30 NOTE — Progress Notes (Signed)
08/30/2017 11:32 AM   Candyce Churn 1950-07-28 [id number]  Referring provider: Adline Potter, MD [address] [place], [place] [address]  Chief Complaint  Patient presents with  . New Patient (Initial Visit)    HPI: 67 year old male referred to establish care with urology for multiple urologic issues.  These include a history of BPH with urinary symptoms, recurrent testicular pain/epididymoorchitis, history of renal mass, and erectile dysfunction.  He also has a personal history of hypogonadism.  He has had several urologist in the past including seen a urologist in 2016 [facility] of [place] for evaluation for renal mass and then more recently in [place].  He is recently moved to the area and seeking to establish care with a urologist here.  Records from his previous urologist were requested.  He has not had any recent renal imaging including none in our system.  He does report a history of long-standing urinary symptoms.  IPSS as below.  He is mostly bothered by weak stream, urinary frequency, nocturia x2.  PVR minimal.  He was recently started on Flomax, 0.8 mg by Dr. Vicente Masson.  This is helped somewhat with his urinary symptoms but he still bothered by this.  He was seen and evaluated for left testicular pain by his primary care back in October 2018.  He was treated with Levaquin.  He had several subsequent visits and treated again in 06/2017 with recurrent symptoms.  He had fairly significant improvement but continues to have occasional dull aching pain which comes and goes.  He has a personal history of erectile dysfunction.  He was taking Cialis 5 mg daily in the past.  He does have a personal history of hypogonadism  reports being treated with testosterone supplementation in the past.  He is not interested in pursuing this and had no appreciable difference in his symptoms on this medication.  IPSS    Row Name 08/30/17 1600         International Prostate  Symptom Score   How often have you had the sensation of not emptying your bladder?  Less than half the time     How often have you had to urinate less than every two hours?  Less than 1 in 5 times     How often have you found you stopped and started again several times when you urinated?  Less than half the time     How often have you found it difficult to postpone urination?  About half the time     How often have you had a weak urinary stream?  More than half the time     How often have you had to strain to start urination?  Less than half the time     How many times did you typically get up at night to urinate?  2 Times     Total IPSS Score  16       Quality of Life due to urinary symptoms   If you were to spend the rest of your life with your urinary condition just the way it is now how would you feel about that?  Mixed        Score:  1-7 Mild 8-19 Moderate 20-35 Severe    PMH: Past Medical History:  Diagnosis Date  . BPH (benign prostatic hyperplasia)   . Hyperlipidemia   . Peripheral neuropathy     Surgical History: Past Surgical History:  Procedure Laterality Date  . ROTATOR CUFF REPAIR Left   .  TONSILLECTOMY      Home Medications:  Allergies as of 08/30/2017   No Known Allergies     Medication List        Accurate as of 08/30/17 11:59 PM. Always use your most recent med list.          B-12 500 MCG Tabs Take 1 tablet by mouth daily.   citalopram 20 MG tablet Commonly known as:  CELEXA Take 1 tablet (20 mg total) by mouth daily.   finasteride 5 MG tablet Commonly known as:  PROSCAR Take 1 tablet (5 mg total) by mouth daily.   gabapentin 600 MG tablet Commonly known as:  NEURONTIN Take 2 tablets (1,200 mg total) by mouth 2 (two) times daily.   tamsulosin 0.4 MG Caps capsule Commonly known as:  FLOMAX Take 2 capsules (0.8 mg total) daily by mouth.       Allergies: No Known Allergies  Family History: Family History  Problem Relation Age of  Onset  . Cancer Mother   . Diabetes Brother   . Kidney cancer Neg Hx   . Kidney disease Neg Hx   . Prostate cancer Neg Hx     Social History:  reports that  has never smoked. he has never used smokeless tobacco. He reports that he drinks alcohol. He reports that he does not use drugs.  ROS: UROLOGY Frequent Urination?: Yes Hard to postpone urination?: No Burning/pain with urination?: No Get up at night to urinate?: Yes Leakage of urine?: Yes Urine stream starts and stops?: Yes Trouble starting stream?: Yes Do you have to strain to urinate?: No Blood in urine?: No Urinary tract infection?: No Sexually transmitted disease?: No Injury to kidneys or bladder?: No Painful intercourse?: No Weak stream?: Yes Erection problems?: Yes Penile pain?: No  Gastrointestinal Nausea?: No Vomiting?: No Indigestion/heartburn?: No Diarrhea?: No Constipation?: No  Constitutional Fever: No Night sweats?: No Weight loss?: No Fatigue?: Yes  Skin Skin rash/lesions?: No Itching?: No  Eyes Blurred vision?: No Double vision?: No  Ears/Nose/Throat Sore throat?: No Sinus problems?: Yes  Hematologic/Lymphatic Swollen glands?: No Easy bruising?: No  Cardiovascular Leg swelling?: No Chest pain?: No  Respiratory Cough?: No Shortness of breath?: No  Endocrine Excessive thirst?: No  Musculoskeletal Back pain?: No Joint pain?: No  Neurological Headaches?: No Dizziness?: No  Psychologic Depression?: No Anxiety?: No  Physical Exam: BP 125/69   Pulse 90   Ht 5\' 6"  (1.676 m)   Wt 199 lb (90.3 kg)   BMI 32.12 kg/m   Constitutional:  Alert and oriented, No acute distress. HEENT: [place] AT, moist mucus membranes.  Trachea midline, no masses. Cardiovascular: No clubbing, cyanosis, or edema. Respiratory: Normal respiratory effort, no increased work of breathing. GI: Abdomen is soft, nontender, nondistended, no abdominal masses GU: Normal phallus with orthotopic meatus.   Bilateral descended testicles, nontender, no pathology identified, no epididymal tenderness. Rectal: Normal sphincter tone.  40 cc prostate, nontender, no nodules. Skin: No rashes, bruises or suspicious lesions. Neurologic: Grossly intact, no focal deficits, moving all 4 extremities. Psychiatric: Normal mood and affect.  Laboratory Data: Lab Results  Component Value Date   WBC 7.2 04/10/2017   HGB 15.4 04/10/2017   HCT 45.4 04/10/2017   MCV 87 04/10/2017   PLT 237 04/10/2017    Lab Results  Component Value Date   CREATININE 0.81 04/10/2017    Urinalysis Component     Latest Ref Rng & Units 08/30/2017  Color, Urine     YELLOW YELLOW  Appearance  CLEAR CLOUDY (A)  Specific Gravity, Urine     1.005 - 1.030 1.020  pH     5.0 - 8.0 7.5  Glucose     NEGATIVE mg/dL NEGATIVE  Hgb urine dipstick     NEGATIVE NEGATIVE  Bilirubin Urine     NEGATIVE NEGATIVE  Ketones, ur     NEGATIVE mg/dL NEGATIVE  Protein     NEGATIVE mg/dL NEGATIVE  Nitrite     NEGATIVE NEGATIVE  Leukocytes, UA     NEGATIVE NEGATIVE  Squamous Epithelial / LPF     NONE SEEN 0-5 (A)  WBC, UA     0 - 5 WBC/hpf 0-5  RBC / HPF     0 - 5 RBC/hpf 0-5  Bacteria, UA     NONE SEEN FEW (A)  Amorphous Crystal      PRESENT    Pertinent Imaging: Results for orders placed or performed in visit on 08/30/17  BLADDER SCAN AMB NON-IMAGING  Result Value Ref Range   Scan Result 28     Assessment & Plan:    1. Benign prostatic hyperplasia with urinary obstruction Currently on Flomax 0.8 mg started by Dr. Vicente Masson in 04/2017 Persistent symptoms, will add finasteride to regimen Rectal exam unremarkable today without nodules, will order PSA for screening purposes If symptoms fail to improve with addition of finasteride in a few months, will consider addition of anticholinergic for discussion of outlet procedure - BLADDER SCAN AMB NON-IMAGING - Urinalysis, Complete w Microscopic (For [facility] ONLY); Future  2.  Renal mass Based on previous imaging from 2016 report at outside facility, 9 mm exophytic isodense right renal lesion No recent imaging available We will obtain records Renal ultrasound for baseline - US RENAL; Future - PSA; Future  3. Left epididymitis Appears to have resolved Suspect chronic testicular discomfort Recommend NSAIDs as tolerated and scrotal support  4. History of hypogonadism Previously on testosterone therapy Minimal improvement in overall symptoms, not interested in pursuing this at this time  5. Combined arterial insufficiency and corporo-venous occlusive erectile dysfunction Personal history of ED Given length of visit today, we will defer this until the next visit  Records from his previous urologist were requested today.  We will hopefully receive these prior to his next follow-up visit.   Return in about 3 months (around 11/30/2017).  Hollice Espy, MD  [facility] [address] [place], [place] [address] [phone]

## 2017-08-31 LAB — PSA: Prostatic Specific Antigen: 0.48 ng/mL (ref 0.00–4.00)

## 2017-09-03 ENCOUNTER — Ambulatory Visit: Payer: Medicare Other

## 2017-09-09 ENCOUNTER — Ambulatory Visit
Admission: RE | Admit: 2017-09-09 | Discharge: 2017-09-09 | Disposition: A | Discharge status: Home / Self Care | Payer: Medicare Other | Source: Ambulatory Visit | Attending: Urology | Admitting: Urology

## 2017-09-09 ENCOUNTER — Telehealth: Payer: Self-pay

## 2017-09-09 DIAGNOSIS — N2889 Other specified disorders of kidney and ureter: Secondary | ICD-10-CM | POA: Diagnosis not present

## 2017-09-09 DIAGNOSIS — N281 Cyst of kidney, acquired: Secondary | ICD-10-CM | POA: Diagnosis not present

## 2017-09-09 NOTE — Telephone Encounter (Signed)
-----   Message from Hollice Espy, MD sent at 09/09/2017  4:06 PM EDT ----- RUS looks great.  I will see him on June as scheduled.    Hollice Espy, MD

## 2017-09-09 NOTE — Telephone Encounter (Signed)
Letter sent.

## 2017-10-02 DIAGNOSIS — L570 Actinic keratosis: Secondary | ICD-10-CM | POA: Diagnosis not present

## 2017-10-02 DIAGNOSIS — L578 Other skin changes due to chronic exposure to nonionizing radiation: Secondary | ICD-10-CM | POA: Diagnosis not present

## 2017-10-02 DIAGNOSIS — L218 Other seborrheic dermatitis: Secondary | ICD-10-CM | POA: Diagnosis not present

## 2017-10-02 DIAGNOSIS — D485 Neoplasm of uncertain behavior of skin: Secondary | ICD-10-CM | POA: Diagnosis not present

## 2017-10-02 DIAGNOSIS — Z872 Personal history of diseases of the skin and subcutaneous tissue: Secondary | ICD-10-CM | POA: Diagnosis not present

## 2017-10-22 DIAGNOSIS — D485 Neoplasm of uncertain behavior of skin: Secondary | ICD-10-CM | POA: Diagnosis not present

## 2017-10-22 DIAGNOSIS — D2262 Melanocytic nevi of left upper limb, including shoulder: Secondary | ICD-10-CM | POA: Diagnosis not present

## 2017-11-13 ENCOUNTER — Other Ambulatory Visit: Payer: Self-pay | Admitting: Family Medicine

## 2017-11-13 ENCOUNTER — Encounter: Payer: Self-pay | Admitting: Family Medicine

## 2017-11-13 ENCOUNTER — Ambulatory Visit (INDEPENDENT_AMBULATORY_CARE_PROVIDER_SITE_OTHER): Payer: Medicare Other | Admitting: Family Medicine

## 2017-11-13 VITALS — BP 122/80 | HR 88 | Ht 65.5 in | Wt 208.0 lb

## 2017-11-13 DIAGNOSIS — M7541 Impingement syndrome of right shoulder: Secondary | ICD-10-CM | POA: Diagnosis not present

## 2017-11-13 MED ORDER — MELOXICAM 15 MG PO TABS: 15.0000 mg | ORAL_TABLET | Freq: Every day | ORAL | 0 refills | Status: DC

## 2017-11-13 NOTE — Patient Instructions (Signed)
Shoulder Impingement Syndrome Shoulder impingement syndrome is a condition that causes pain when connective tissues (tendons) surrounding the shoulder joint become pinched. These tendons are part of the group of muscles and tissues that help to stabilize the shoulder (rotator cuff). Beneath the rotator cuff is a fluid-filled sac (bursa) that allows the muscles and tendons to glide smoothly. The bursa may become swollen or irritated (bursitis). Bursitis, swelling in the rotator cuff tendons, or both conditions can decrease how much space is under a bone in the shoulder joint (acromion), resulting in impingement. What are the causes? Shoulder impingement syndrome can be caused by bursitis or swelling of the rotator cuff tendons, which may result from:  Repetitive overhead arm movements.  Falling onto the shoulder.  Weakness in the shoulder muscles.  What increases the risk? You may be more likely to develop this condition if you are an athlete who participates in:  Sports that involve throwing, such as baseball.  Tennis.  Swimming.  Volleyball.  Some people are also more likely to develop impingement syndrome because of the shape of their acromion bone. What are the signs or symptoms? The main symptom of this condition is pain on the front or side of the shoulder. Pain may:  Get worse when lifting or raising the arm.  Get worse at night.  Wake you up from sleeping.  Feel sharp when the shoulder is moved, and then fade to an ache.  Other signs and symptoms may include:  Tenderness.  Stiffness.  Inability to raise the arm above shoulder level or behind the body.  Weakness.  How is this diagnosed? This condition may be diagnosed based on:  Your symptoms.  Your medical history.  A physical exam.  Imaging tests, such as: ? X-rays. ? MRI. ? Ultrasound.  How is this treated? Treatment for this condition may include:  Resting your shoulder and avoiding all  activities that cause pain or put stress on the shoulder.  Icing your shoulder.  NSAIDs to help reduce pain and swelling.  One or more injections of medicines to numb the area and reduce inflammation.  Physical therapy.  Surgery. This may be needed if nonsurgical treatments have not helped. Surgery may involve repairing the rotator cuff, reshaping the acromion, or removing the bursa.  Follow these instructions at home: Managing pain, stiffness, and swelling  If directed, apply ice to the injured area. ? Put ice in a plastic bag. ? Place a towel between your skin and the bag. ? Leave the ice on for 20 minutes, 2-3 times a day. Activity  Rest and return to your normal activities as told by your health care provider. Ask your health care provider what activities are safe for you.  Do exercises as told by your health care provider. General instructions  Do not use any tobacco products, including cigarettes, chewing tobacco, or e-cigarettes. Tobacco can delay healing. If you need help quitting, ask your health care provider.  Ask your health care provider when it is safe for you to drive.  Take over-the-counter and prescription medicines only as told by your health care provider.  Keep all follow-up visits as told by your health care provider. This is important. How is this prevented?  Give your body time to rest between periods of activity.  Be safe and responsible while being active to avoid falls.  Maintain physical fitness, including strength and flexibility. Contact a health care provider if:  Your symptoms have not improved after 1-2 months of treatment and   rest.  You cannot lift your arm away from your body. This information is not intended to replace advice given to you by your health care provider. Make sure you discuss any questions you have with your health care provider. Document Released: 06/11/2005 Document Revised: 02/16/2016 Document Reviewed:  05/14/2015 Elsevier Interactive Patient Education  2018 Elsevier Inc[employer]  

## 2017-11-13 NOTE — Progress Notes (Signed)
Name: Eddie Patterson   MRN: [id number]    DOB: February 03, 1951   Date:11/14/2017       Progress Note  Subjective  Chief Complaint  Chief Complaint  Patient presents with  . Shoulder Pain    R) shoulder pain- decreased ROM- motrin helps some but would like a referral to ortho for possible injection    Shoulder Pain   The pain is present in the right shoulder. This is a new problem. The current episode started more than 1 month ago. There has been a history of trauma. The problem occurs constantly. The problem has been unchanged. The quality of the pain is described as aching. The pain is at a severity of 5/10. The pain is moderate. Associated symptoms include a limited range of motion. Pertinent negatives include no fever, joint locking, joint swelling, numbness, stiffness or tingling. The symptoms are aggravated by activity (abduction). He has tried NSAIDS for the symptoms. The treatment provided mild relief. left shoulder rotator cuff tear    No problem-specific Assessment & Plan notes found for this encounter.   Past Medical History:  Diagnosis Date  . BPH (benign prostatic hyperplasia)   . Hyperlipidemia   . Peripheral neuropathy     Past Surgical History:  Procedure Laterality Date  . ROTATOR CUFF REPAIR Left   . TONSILLECTOMY      Family History  Problem Relation Age of Onset  . Cancer Mother   . Diabetes Brother   . Kidney cancer Neg Hx   . Kidney disease Neg Hx   . Prostate cancer Neg Hx     Social History   Socioeconomic History  . Marital status: Married    Spouse name: Not on file  . Number of children: Not on file  . Years of education: Not on file  . Highest education level: Not on file  Occupational History  . Not on file  Social Needs  . Financial resource strain: Not on file  . Food insecurity:    Worry: Patient refused    Inability: Patient refused  . Transportation needs:    Medical: Patient refused    Non-medical: Patient refused  Tobacco  Use  . Smoking status: Never Smoker  . Smokeless tobacco: Never Used  Substance and Sexual Activity  . Alcohol use: Yes  . Drug use: No  . Sexual activity: Yes  Lifestyle  . Physical activity:    Days per week: 3 days    Minutes per session: 30 min  . Stress: Not at all  Relationships  . Social connections:    Talks on phone: Patient refused    Gets together: Patient refused    Attends religious service: Patient refused    Active member of club or organization: Patient refused    Attends meetings of clubs or organizations: Patient refused    Relationship status: Patient refused  . Intimate partner violence:    Fear of current or ex partner: Patient refused    Emotionally abused: Patient refused    Physically abused: Patient refused    Forced sexual activity: Patient refused  Other Topics Concern  . Not on file  Social History Narrative  . Not on file    No Known Allergies  Outpatient Medications Prior to Visit  Medication Sig Dispense Refill  . citalopram (CELEXA) 20 MG tablet Take 1 tablet (20 mg total) by mouth daily. 90 tablet 3  . Cyanocobalamin (B-12) 500 MCG TABS Take 1 tablet by mouth daily. 150 tablet   .  finasteride (PROSCAR) 5 MG tablet Take 1 tablet (5 mg total) by mouth daily. 30 tablet 11  . gabapentin (NEURONTIN) 600 MG tablet Take 2 tablets (1,200 mg total) by mouth 2 (two) times daily. 360 tablet 3  . tamsulosin (FLOMAX) 0.4 MG CAPS capsule Take 2 capsules (0.8 mg total) daily by mouth. 180 capsule 3   No facility-administered medications prior to visit.     Review of Systems  Constitutional: Negative for chills, fever, malaise/fatigue and weight loss.  HENT: Negative for ear discharge, ear pain and sore throat.   Eyes: Negative for blurred vision.  Respiratory: Negative for cough, sputum production, shortness of breath and wheezing.   Cardiovascular: Negative for chest pain, palpitations and leg swelling.  Gastrointestinal: Negative for abdominal  pain, blood in stool, constipation, diarrhea, heartburn, melena and nausea.  Genitourinary: Negative for dysuria, frequency, hematuria and urgency.  Musculoskeletal: Negative for back pain, joint pain, myalgias, neck pain and stiffness.  Skin: Negative for rash.  Neurological: Negative for dizziness, tingling, sensory change, focal weakness, numbness and headaches.  Endo/Heme/Allergies: Negative for environmental allergies and polydipsia. Does not bruise/bleed easily.  Psychiatric/Behavioral: Negative for depression and suicidal ideas. The patient is not nervous/anxious and does not have insomnia.      Objective  Vitals:   11/13/17 0912  BP: 122/80  Pulse: 88  Weight: 208 lb (94.3 kg)  Height: 5' 5.5" (1.664 m)    Physical Exam  Nursing note and vitals reviewed.     Assessment & Plan  Problem List Items Addressed This Visit    None    Visit Diagnoses    Impingement syndrome of right shoulder    -  Primary   desires referral/ unable to cast fishing   Relevant Orders   Ambulatory referral to Orthopedic Surgery      Meds ordered this encounter  Medications  . DISCONTD: meloxicam (MOBIC) 15 MG tablet    Sig: Take 1 tablet (15 mg total) by mouth daily.    Dispense:  30 tablet    Refill:  0      Dr. Arlena Marsan [facility]  11/14/17

## 2017-11-29 ENCOUNTER — Ambulatory Visit: Payer: Medicare Other | Admitting: Urology

## 2017-12-16 ENCOUNTER — Encounter: Payer: Self-pay | Admitting: Family Medicine

## 2017-12-16 ENCOUNTER — Ambulatory Visit (INDEPENDENT_AMBULATORY_CARE_PROVIDER_SITE_OTHER): Payer: Medicare Other | Admitting: Family Medicine

## 2017-12-16 VITALS — BP 100/62 | HR 84 | Ht 65.5 in | Wt 207.0 lb

## 2017-12-16 DIAGNOSIS — B354 Tinea corporis: Secondary | ICD-10-CM

## 2017-12-16 MED ORDER — CLOTRIMAZOLE-BETAMETHASONE 1-0.05 % EX CREA: 1.0000 "application " | TOPICAL_CREAM | Freq: Two times a day (BID) | CUTANEOUS | 0 refills | Status: DC

## 2017-12-16 NOTE — Progress Notes (Signed)
Name: Eddie Patterson   MRN: [id number]    DOB: 17-Aug-1950   Date:12/16/2017       Progress Note  Subjective  Chief Complaint  Chief Complaint  Patient presents with  . Rash    thinks he has ringworm on R) lower leg- noticed it approx 1 weeks ago. Does not itch or bother him. Has put hydrocortisone cream on it but it seems to be spreading or "moving over"    Rash  This is a new problem. The current episode started 1 to 4 weeks ago (1-2 weeks). The problem has been gradually worsening (Gradually enlarging) since onset. The affected locations include the right lower leg and right ankle. He was exposed to nothing. Pertinent negatives include no cough, diarrhea, fatigue, fever, joint pain, nail changes, shortness of breath or sore throat. Past treatments include topical steroids. The treatment provided no relief. There is no history of eczema. (Rosacea)    No problem-specific Assessment & Plan notes found for this encounter.   Past Medical History:  Diagnosis Date  . BPH (benign prostatic hyperplasia)   . Hyperlipidemia   . Peripheral neuropathy     Past Surgical History:  Procedure Laterality Date  . ROTATOR CUFF REPAIR Left   . TONSILLECTOMY      Family History  Problem Relation Age of Onset  . Cancer Mother   . Diabetes Brother   . Kidney cancer Neg Hx   . Kidney disease Neg Hx   . Prostate cancer Neg Hx     Social History   Socioeconomic History  . Marital status: Married    Spouse name: Not on file  . Number of children: Not on file  . Years of education: Not on file  . Highest education level: Not on file  Occupational History  . Not on file  Social Needs  . Financial resource strain: Not on file  . Food insecurity:    Worry: Patient refused    Inability: Patient refused  . Transportation needs:    Medical: Patient refused    Non-medical: Patient refused  Tobacco Use  . Smoking status: Never Smoker  . Smokeless tobacco: Never Used  Substance and  Sexual Activity  . Alcohol use: Yes  . Drug use: No  . Sexual activity: Yes  Lifestyle  . Physical activity:    Days per week: 3 days    Minutes per session: 30 min  . Stress: Not at all  Relationships  . Social connections:    Talks on phone: Patient refused    Gets together: Patient refused    Attends religious service: Patient refused    Active member of club or organization: Patient refused    Attends meetings of clubs or organizations: Patient refused    Relationship status: Patient refused  . Intimate partner violence:    Fear of current or ex partner: Patient refused    Emotionally abused: Patient refused    Physically abused: Patient refused    Forced sexual activity: Patient refused  Other Topics Concern  . Not on file  Social History Narrative  . Not on file    No Known Allergies  Outpatient Medications Prior to Visit  Medication Sig Dispense Refill  . citalopram (CELEXA) 20 MG tablet Take 1 tablet (20 mg total) by mouth daily. 90 tablet 3  . Cyanocobalamin (B-12) 500 MCG TABS Take 1 tablet by mouth daily. 150 tablet   . finasteride (PROSCAR) 5 MG tablet Take 1 tablet (5 mg total)  by mouth daily. 30 tablet 11  . gabapentin (NEURONTIN) 600 MG tablet Take 2 tablets (1,200 mg total) by mouth 2 (two) times daily. 360 tablet 3  . meloxicam (MOBIC) 15 MG tablet TAKE 1 TABLET(15 MG) BY MOUTH DAILY 90 tablet 0  . tamsulosin (FLOMAX) 0.4 MG CAPS capsule Take 2 capsules (0.8 mg total) daily by mouth. 180 capsule 3   No facility-administered medications prior to visit.     Review of Systems  Constitutional: Negative for chills, fatigue, fever, malaise/fatigue and weight loss.  HENT: Negative for ear discharge, ear pain and sore throat.   Eyes: Negative for blurred vision.  Respiratory: Negative for cough, sputum production, shortness of breath and wheezing.   Cardiovascular: Negative for chest pain, palpitations and leg swelling.  Gastrointestinal: Negative for  abdominal pain, blood in stool, constipation, diarrhea, heartburn, melena and nausea.  Genitourinary: Negative for dysuria, frequency, hematuria and urgency.  Musculoskeletal: Negative for back pain, joint pain, myalgias and neck pain.  Skin: Positive for rash. Negative for nail changes.  Neurological: Negative for dizziness, tingling, sensory change, focal weakness and headaches.  Endo/Heme/Allergies: Negative for environmental allergies and polydipsia. Does not bruise/bleed easily.  Psychiatric/Behavioral: Negative for depression and suicidal ideas. The patient is not nervous/anxious and does not have insomnia.      Objective  Vitals:   12/16/17 1343  BP: 100/62  Pulse: 84  Weight: 207 lb (93.9 kg)  Height: 5' 5.5" (1.664 m)    Physical Exam  Constitutional: He is oriented to person, place, and time.  HENT:  Head: Normocephalic.  Right Ear: External ear normal.  Left Ear: External ear normal.  Nose: Nose normal.  Mouth/Throat: Oropharynx is clear and moist.  Eyes: Pupils are equal, round, and reactive to light. Conjunctivae and EOM are normal. Right eye exhibits no discharge. Left eye exhibits no discharge. No scleral icterus.  Neck: Normal range of motion. Neck supple. No JVD present. No tracheal deviation present. No thyromegaly present.  Cardiovascular: Normal rate, regular rhythm, normal heart sounds and intact distal pulses. Exam reveals no gallop and no friction rub.  No murmur heard. Pulmonary/Chest: Breath sounds normal. No respiratory distress. He has no wheezes. He has no rales.  Abdominal: Soft. Bowel sounds are normal. He exhibits no mass. There is no hepatosplenomegaly. There is no tenderness. There is no rebound, no guarding and no CVA tenderness.  Musculoskeletal: Normal range of motion. He exhibits no edema or tenderness.  Lymphadenopathy:    He has no cervical adenopathy.  Neurological: He is alert and oriented to person, place, and time. He has normal strength  and normal reflexes. No cranial nerve deficit.  Skin: Skin is warm. No rash noted.  Nursing note and vitals reviewed.     Assessment & Plan  Problem List Items Addressed This Visit    None    Visit Diagnoses    Tinea corporis    -  Primary   Likely tinea corporis differential includes eczema. treat with lotrisone bid.   Relevant Medications   clotrimazole-betamethasone (LOTRISONE) cream      Meds ordered this encounter  Medications  . clotrimazole-betamethasone (LOTRISONE) cream    Sig: Apply 1 application topically 2 (two) times daily.    Dispense:  30 g    Refill:  0      Dr. Macon [facility]  12/16/17

## 2017-12-16 NOTE — Patient Instructions (Signed)

## 2018-02-08 ENCOUNTER — Other Ambulatory Visit: Payer: Self-pay | Admitting: Family Medicine

## 2018-02-08 DIAGNOSIS — M7541 Impingement syndrome of right shoulder: Secondary | ICD-10-CM

## 2018-02-19 ENCOUNTER — Ambulatory Visit (INDEPENDENT_AMBULATORY_CARE_PROVIDER_SITE_OTHER): Payer: Medicare Other | Admitting: Family Medicine

## 2018-02-19 ENCOUNTER — Encounter: Payer: Self-pay | Admitting: Family Medicine

## 2018-02-19 VITALS — BP 122/60 | HR 60 | Ht 65.5 in | Wt 202.0 lb

## 2018-02-19 DIAGNOSIS — M7541 Impingement syndrome of right shoulder: Secondary | ICD-10-CM | POA: Insufficient documentation

## 2018-02-19 DIAGNOSIS — S300XXA Contusion of lower back and pelvis, initial encounter: Secondary | ICD-10-CM

## 2018-02-19 DIAGNOSIS — N401 Enlarged prostate with lower urinary tract symptoms: Secondary | ICD-10-CM | POA: Diagnosis not present

## 2018-02-19 DIAGNOSIS — F331 Major depressive disorder, recurrent, moderate: Secondary | ICD-10-CM | POA: Diagnosis not present

## 2018-02-19 DIAGNOSIS — R351 Nocturia: Secondary | ICD-10-CM

## 2018-02-19 DIAGNOSIS — Z23 Encounter for immunization: Secondary | ICD-10-CM | POA: Diagnosis not present

## 2018-02-19 DIAGNOSIS — G609 Hereditary and idiopathic neuropathy, unspecified: Secondary | ICD-10-CM

## 2018-02-19 MED ORDER — MELOXICAM 15 MG PO TABS: ORAL_TABLET | ORAL | 3 refills | Status: AC

## 2018-02-19 MED ORDER — FINASTERIDE 5 MG PO TABS: 5.0000 mg | ORAL_TABLET | Freq: Every day | ORAL | 3 refills | Status: AC

## 2018-02-19 MED ORDER — TAMSULOSIN HCL 0.4 MG PO CAPS: 0.8000 mg | ORAL_CAPSULE | Freq: Every day | ORAL | 3 refills | Status: AC

## 2018-02-19 MED ORDER — CITALOPRAM HYDROBROMIDE 20 MG PO TABS: 20.0000 mg | ORAL_TABLET | Freq: Every day | ORAL | 3 refills | Status: AC

## 2018-02-19 MED ORDER — GABAPENTIN 600 MG PO TABS: 1200.0000 mg | ORAL_TABLET | Freq: Two times a day (BID) | ORAL | 3 refills | Status: AC

## 2018-02-19 NOTE — Assessment & Plan Note (Signed)
Followed with [facility] urology. Continue fiasteride 5 mg daily and tamulosin 0.4mg  2 tablets daily.

## 2018-02-19 NOTE — Progress Notes (Addendum)
Name: Eddie Patterson   MRN: [id number]    DOB: 1950/09/20   Date:02/19/2018       Progress Note  Subjective  Chief Complaint  Chief Complaint  Patient presents with  . Depression  . Arthritis  . Peripheral Neuropathy  . Flu Vaccine    Depression       The patient presents with depression.  This is a chronic problem.  The current episode started more than 1 year ago.   The onset quality is undetermined.   The problem occurs rarely.The problem is unchanged.  Associated symptoms include no decreased concentration, no fatigue, no helplessness, no hopelessness, does not have insomnia, not irritable, no restlessness, no decreased interest, no appetite change, no body aches, no myalgias, no headaches, no indigestion, not sad and no suicidal ideas.     The symptoms are aggravated by nothing.  Past treatments include SSRIs - Selective serotonin reuptake inhibitors.  Previous treatment provided mild relief.  Past medical history includes depression.   Arthritis  Presents for follow-up visit. He complains of pain and stiffness. The symptoms have been stable. Affected locations include the right shoulder. Pertinent negatives include no diarrhea, dysuria, fatigue, fever, rash or weight loss.  Neurologic Problem  The patient's pertinent negatives include no altered mental status, clumsiness, focal sensory loss, focal weakness, loss of balance, memory loss, near-syncope, slurred speech, syncope, visual change or weakness. This is a chronic problem. The problem has been waxing and waning since onset. There was lower extremity focality noted. Pertinent negatives include no abdominal pain, back pain, chest pain, dizziness, fatigue, fever, headaches, nausea, neck pain, palpitations or shortness of breath. Treatments tried: gabapentin. The treatment provided moderate relief. There is no history of a CVA.  Fall  The accident occurred more than 1 week ago. The fall occurred while standing. Point of impact:  buttock. The pain is at a severity of 7/10. The pain is moderate. Pertinent negatives include no abdominal pain, fever, headaches, hematuria, nausea, tingling or visual change. He has tried NSAID for the symptoms. The treatment provided moderate relief.    Impingement syndrome of right shoulder Recurrent pain shoulder. Continue meloxicam daily prn pain.  Depression Chronic Controlled. PHQ 0 Continue celexa 20 mg daily.   BPH associated with nocturia Followed with [facility] urology. Continue fiasteride 5 mg daily and tamulosin 0.4mg  2 tablets daily.   Peripheral neuropathy, idiopathic Chronic Controlled. Primarily involving feet. Continue gabapentin 1200 mg bid .   Past Medical History:  Diagnosis Date  . BPH (benign prostatic hyperplasia)   . Hyperlipidemia   . Peripheral neuropathy     Past Surgical History:  Procedure Laterality Date  . ROTATOR CUFF REPAIR Left   . TONSILLECTOMY      Family History  Problem Relation Age of Onset  . Cancer Mother   . Diabetes Brother   . Kidney cancer Neg Hx   . Kidney disease Neg Hx   . Prostate cancer Neg Hx     Social History   Socioeconomic History  . Marital status: Married    Spouse name: Not on file  . Number of children: Not on file  . Years of education: Not on file  . Highest education level: Not on file  Occupational History  . Not on file  Social Needs  . Financial resource strain: Not on file  . Food insecurity:    Worry: Patient refused    Inability: Patient refused  . Transportation needs:    Medical: Patient refused  Non-medical: Patient refused  Tobacco Use  . Smoking status: Never Smoker  . Smokeless tobacco: Never Used  Substance and Sexual Activity  . Alcohol use: Yes  . Drug use: No  . Sexual activity: Yes  Lifestyle  . Physical activity:    Days per week: 3 days    Minutes per session: 30 min  . Stress: Not at all  Relationships  . Social connections:    Talks on phone: Patient refused     Gets together: Patient refused    Attends religious service: Patient refused    Active member of club or organization: Patient refused    Attends meetings of clubs or organizations: Patient refused    Relationship status: Patient refused  . Intimate partner violence:    Fear of current or ex partner: Patient refused    Emotionally abused: Patient refused    Physically abused: Patient refused    Forced sexual activity: Patient refused  Other Topics Concern  . Not on file  Social History Narrative  . Not on file    No Known Allergies  Outpatient Medications Prior to Visit  Medication Sig Dispense Refill  . Cyanocobalamin (B-12) 500 MCG TABS Take 1 tablet by mouth daily. 150 tablet   . citalopram (CELEXA) 20 MG tablet Take 1 tablet (20 mg total) by mouth daily. 90 tablet 3  . finasteride (PROSCAR) 5 MG tablet Take 1 tablet (5 mg total) by mouth daily. (Patient taking differently: Take 5 mg by mouth daily. Hollice Espy) 30 tablet 11  . gabapentin (NEURONTIN) 600 MG tablet Take 2 tablets (1,200 mg total) by mouth 2 (two) times daily. 360 tablet 3  . meloxicam (MOBIC) 15 MG tablet TAKE 1 TABLET(15 MG) BY MOUTH DAILY 90 tablet 0  . tamsulosin (FLOMAX) 0.4 MG CAPS capsule Take 2 capsules (0.8 mg total) daily by mouth. (Patient taking differently: Take 0.8 mg by mouth daily. Hollice Espy) 180 capsule 3  . clotrimazole-betamethasone (LOTRISONE) cream Apply 1 application topically 2 (two) times daily. 30 g 0   No facility-administered medications prior to visit.     Review of Systems  Constitutional: Negative for appetite change, chills, fatigue, fever, malaise/fatigue and weight loss.  HENT: Negative for ear discharge, ear pain and sore throat.   Eyes: Negative for blurred vision.  Respiratory: Negative for cough, sputum production, shortness of breath and wheezing.   Cardiovascular: Negative for chest pain, palpitations, leg swelling and near-syncope.  Gastrointestinal: Negative for  abdominal pain, blood in stool, constipation, diarrhea, heartburn, melena and nausea.  Genitourinary: Negative for dysuria, frequency, hematuria and urgency.  Musculoskeletal: Positive for arthritis and stiffness. Negative for back pain, joint pain, myalgias and neck pain.  Skin: Negative for rash.  Neurological: Negative for dizziness, tingling, sensory change, focal weakness, syncope, weakness, headaches and loss of balance.  Endo/Heme/Allergies: Negative for environmental allergies and polydipsia. Does not bruise/bleed easily.  Psychiatric/Behavioral: Positive for depression. Negative for decreased concentration, memory loss and suicidal ideas. The patient is not nervous/anxious and does not have insomnia.      Objective  Vitals:   02/19/18 1203  BP: 122/60  Pulse: 60  Weight: 202 lb (91.6 kg)  Height: 5' 5.5" (1.664 m)    Physical Exam  Constitutional: He is oriented to person, place, and time. He is not irritable.  HENT:  Head: Normocephalic.  Right Ear: External ear normal.  Left Ear: External ear normal.  Nose: Nose normal.  Mouth/Throat: Oropharynx is clear and moist.  Eyes: Pupils are  equal, round, and reactive to light. Conjunctivae and EOM are normal. Right eye exhibits no discharge. Left eye exhibits no discharge. No scleral icterus.  Neck: Normal range of motion. Neck supple. No JVD present. No tracheal deviation present. No thyromegaly present.  Cardiovascular: Normal rate, regular rhythm, normal heart sounds and intact distal pulses. Exam reveals no gallop and no friction rub.  No murmur heard. Pulmonary/Chest: Breath sounds normal. No respiratory distress. He has no wheezes. He has no rales.  Abdominal: Soft. Bowel sounds are normal. He exhibits no mass. There is no hepatosplenomegaly. There is no tenderness. There is no rebound, no guarding and no CVA tenderness.  Musculoskeletal: Normal range of motion. He exhibits no edema or tenderness.  Lymphadenopathy:    He  has no cervical adenopathy.  Neurological: He is alert and oriented to person, place, and time. He has normal strength and normal reflexes. No cranial nerve deficit.  Skin: Skin is warm. Ecchymosis noted. No rash noted. No erythema. No pallor.     Nursing note and vitals reviewed.     Assessment & Plan  Problem List Items Addressed This Visit      Nervous and Auditory   Peripheral neuropathy, idiopathic - Primary    Chronic Controlled. Primarily involving feet. Continue gabapentin 1200 mg bid .      Relevant Medications   gabapentin (NEURONTIN) 600 MG tablet   citalopram (CELEXA) 20 MG tablet     Genitourinary   BPH associated with nocturia    Followed with [facility] urology. Continue fiasteride 5 mg daily and tamulosin 0.4mg  2 tablets daily.       Relevant Medications   tamsulosin (FLOMAX) 0.4 MG CAPS capsule   finasteride (PROSCAR) 5 MG tablet     Other   Depression    Chronic Controlled. PHQ 0 Continue celexa 20 mg daily.       Relevant Medications   citalopram (CELEXA) 20 MG tablet   Impingement syndrome of right shoulder    Recurrent pain shoulder. Continue meloxicam daily prn pain.      Relevant Medications   meloxicam (MOBIC) 15 MG tablet    Other Visit Diagnoses    Contusion of pelvic region, initial encounter       Contusion resulting from sitting fall 10 days ago. Gradually resolving.    Flu vaccine need       Relevant Orders   Flu Vaccine QUAD 36+ mos IM (Completed)      Meds ordered this encounter  Medications  . meloxicam (MOBIC) 15 MG tablet    Sig: TAKE 1 TABLET(15 MG) BY MOUTH DAILY    Dispense:  90 tablet    Refill:  3  . tamsulosin (FLOMAX) 0.4 MG CAPS capsule    Sig: Take 2 capsules (0.8 mg total) by mouth daily.    Dispense:  180 capsule    Refill:  3  . gabapentin (NEURONTIN) 600 MG tablet    Sig: Take 2 tablets (1,200 mg total) by mouth 2 (two) times daily.    Dispense:  360 tablet    Refill:  3  . citalopram (CELEXA) 20 MG  tablet    Sig: Take 1 tablet (20 mg total) by mouth daily.    Dispense:  90 tablet    Refill:  3  . finasteride (PROSCAR) 5 MG tablet    Sig: Take 1 tablet (5 mg total) by mouth daily.    Dispense:  90 tablet    Refill:  3  Dr. Mac[facility]  02/19/18

## 2018-02-19 NOTE — Assessment & Plan Note (Signed)
Recurrent pain shoulder. Continue meloxicam daily prn pain.

## 2018-02-19 NOTE — Addendum Note (Signed)
Addended by: Juline Patch on: 02/19/2018 12:57 PM   Modules accepted: Orders

## 2018-02-19 NOTE — Assessment & Plan Note (Signed)
Chronic Controlled. Primarily involving feet. Continue gabapentin 1200 mg bid .

## 2018-02-19 NOTE — Assessment & Plan Note (Signed)
Chronic Controlled. PHQ 0 Continue celexa 20 mg daily.

## 2019-02-05 ENCOUNTER — Emergency Department (HOSPITAL_COMMUNITY)
Admission: EM | Admit: 2019-02-05 | Discharge: 2019-02-05 | Disposition: A | Discharge status: Home / Self Care | Payer: Medicare Other | Attending: Emergency Medicine | Admitting: Emergency Medicine

## 2019-02-05 ENCOUNTER — Other Ambulatory Visit: Payer: Self-pay

## 2019-02-05 ENCOUNTER — Encounter (HOSPITAL_COMMUNITY): Payer: Self-pay | Admitting: Emergency Medicine

## 2019-02-05 ENCOUNTER — Emergency Department (HOSPITAL_COMMUNITY): Payer: Medicare Other

## 2019-02-05 DIAGNOSIS — Y92007 Garden or yard of unspecified non-institutional (private) residence as the place of occurrence of the external cause: Secondary | ICD-10-CM | POA: Diagnosis not present

## 2019-02-05 DIAGNOSIS — Y9389 Activity, other specified: Secondary | ICD-10-CM | POA: Insufficient documentation

## 2019-02-05 DIAGNOSIS — S7001XA Contusion of right hip, initial encounter: Secondary | ICD-10-CM

## 2019-02-05 DIAGNOSIS — T24231A Burn of second degree of right lower leg, initial encounter: Secondary | ICD-10-CM | POA: Diagnosis not present

## 2019-02-05 DIAGNOSIS — T2009XA Burn of unspecified degree of multiple sites of head, face, and neck, initial encounter: Secondary | ICD-10-CM | POA: Diagnosis present

## 2019-02-05 DIAGNOSIS — T23101A Burn of first degree of right hand, unspecified site, initial encounter: Secondary | ICD-10-CM | POA: Diagnosis not present

## 2019-02-05 DIAGNOSIS — T25112A Burn of first degree of left ankle, initial encounter: Secondary | ICD-10-CM | POA: Insufficient documentation

## 2019-02-05 DIAGNOSIS — Z79899 Other long term (current) drug therapy: Secondary | ICD-10-CM | POA: Diagnosis not present

## 2019-02-05 DIAGNOSIS — X010XXA Exposure to flames in uncontrolled fire, not in building or structure, initial encounter: Secondary | ICD-10-CM | POA: Insufficient documentation

## 2019-02-05 DIAGNOSIS — T24209A Burn of second degree of unspecified site of unspecified lower limb, except ankle and foot, initial encounter: Secondary | ICD-10-CM

## 2019-02-05 DIAGNOSIS — Y999 Unspecified external cause status: Secondary | ICD-10-CM | POA: Diagnosis not present

## 2019-02-05 DIAGNOSIS — Z23 Encounter for immunization: Secondary | ICD-10-CM | POA: Insufficient documentation

## 2019-02-05 DIAGNOSIS — T300 Burn of unspecified body region, unspecified degree: Secondary | ICD-10-CM

## 2019-02-05 MED ORDER — TETANUS-DIPHTH-ACELL PERTUSSIS 5-2.5-18.5 LF-MCG/0.5 IM SUSP
0.5000 mL | Freq: Once | INTRAMUSCULAR | Status: AC
  Administered 2019-02-05: 0.5 mL via INTRAMUSCULAR
  Filled 2019-02-05: qty 0.5

## 2019-02-05 MED ORDER — ONDANSETRON HCL 4 MG PO TABS
4.0000 mg | ORAL_TABLET | Freq: Once | ORAL | Status: AC
  Administered 2019-02-05: 20:00:00 4 mg via ORAL
  Filled 2019-02-05: qty 1

## 2019-02-05 MED ORDER — SILVER SULFADIAZINE 1 % EX CREA
TOPICAL_CREAM | Freq: Once | CUTANEOUS | Status: AC
  Administered 2019-02-05: 20:00:00 via TOPICAL
  Filled 2019-02-05: qty 100

## 2019-02-05 MED ORDER — TIZANIDINE HCL 4 MG PO TABS: 4.0000 mg | ORAL_TABLET | Freq: Three times a day (TID) | ORAL | 0 refills | Status: AC

## 2019-02-05 MED ORDER — HYDROCODONE-ACETAMINOPHEN 5-325 MG PO TABS: 1.0000 | ORAL_TABLET | ORAL | 0 refills | Status: AC | PRN

## 2019-02-05 MED ORDER — BACITRACIN-NEOMYCIN-POLYMYXIN 400-5-5000 EX OINT
TOPICAL_OINTMENT | Freq: Once | CUTANEOUS | Status: AC
  Administered 2019-02-05: 2 via TOPICAL
  Filled 2019-02-05: qty 2

## 2019-02-05 MED ORDER — MORPHINE SULFATE (PF) 10 MG/ML IV SOLN
10.0000 mg | Freq: Once | INTRAVENOUS | Status: AC
  Administered 2019-02-05: 20:00:00 10 mg via INTRAMUSCULAR
  Filled 2019-02-05: qty 1
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
## 2021-04-20 IMAGING — DX DG HIP (WITH OR WITHOUT PELVIS) 2-3V RIGHT
4 series · 4 of 4 positions shown · non-contrast
Comparison: None.

CLINICAL DATA: Fall today with right hip pain.

EXAM:
DG HIP (WITH OR WITHOUT PELVIS) 2-3V RIGHT

[pelvis ap]
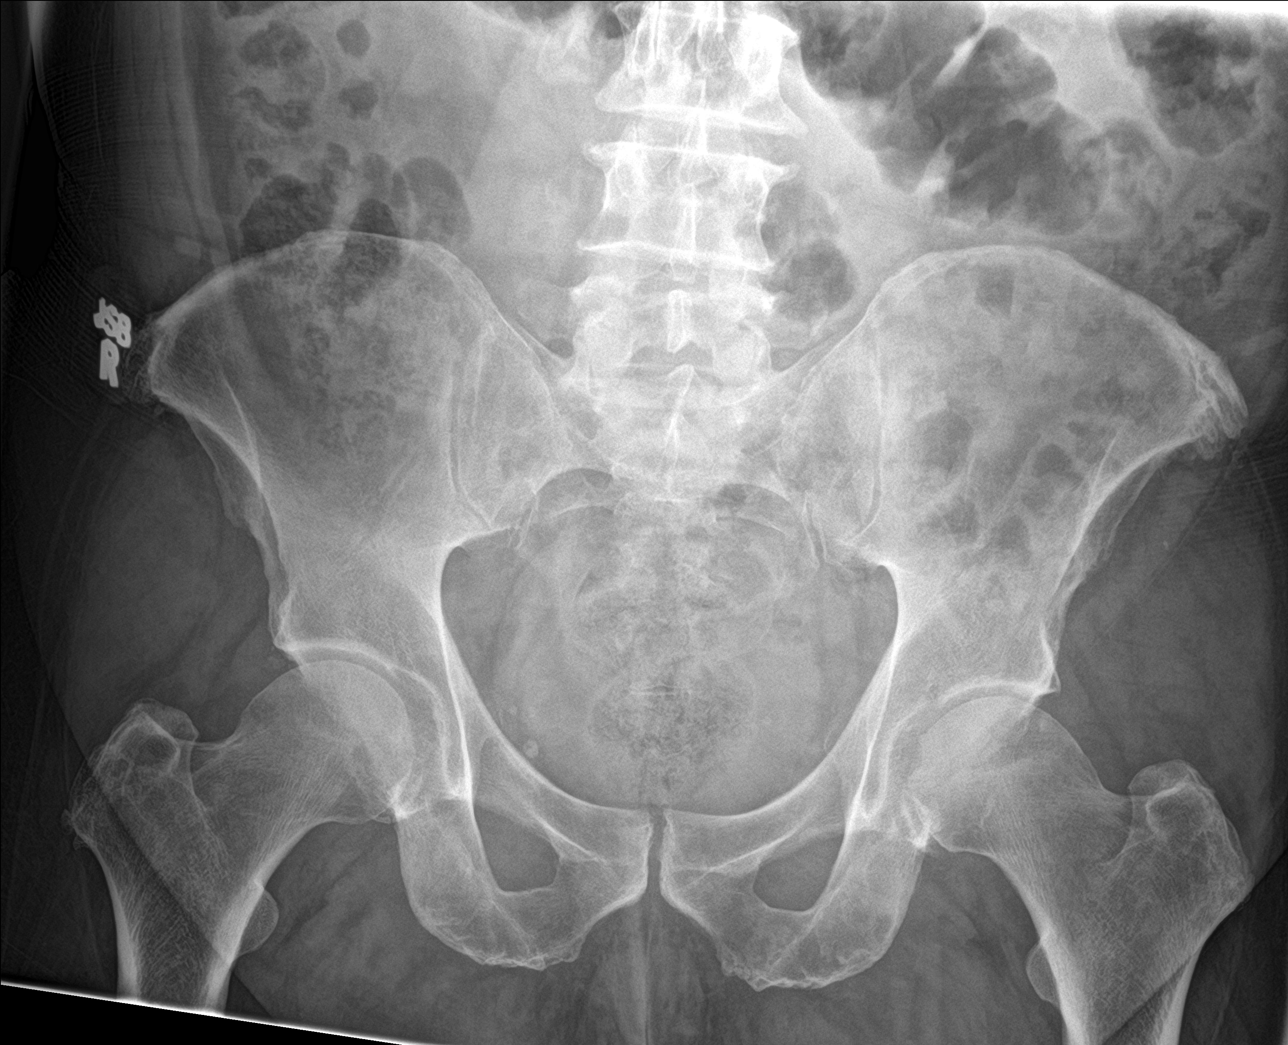

[hip ap]
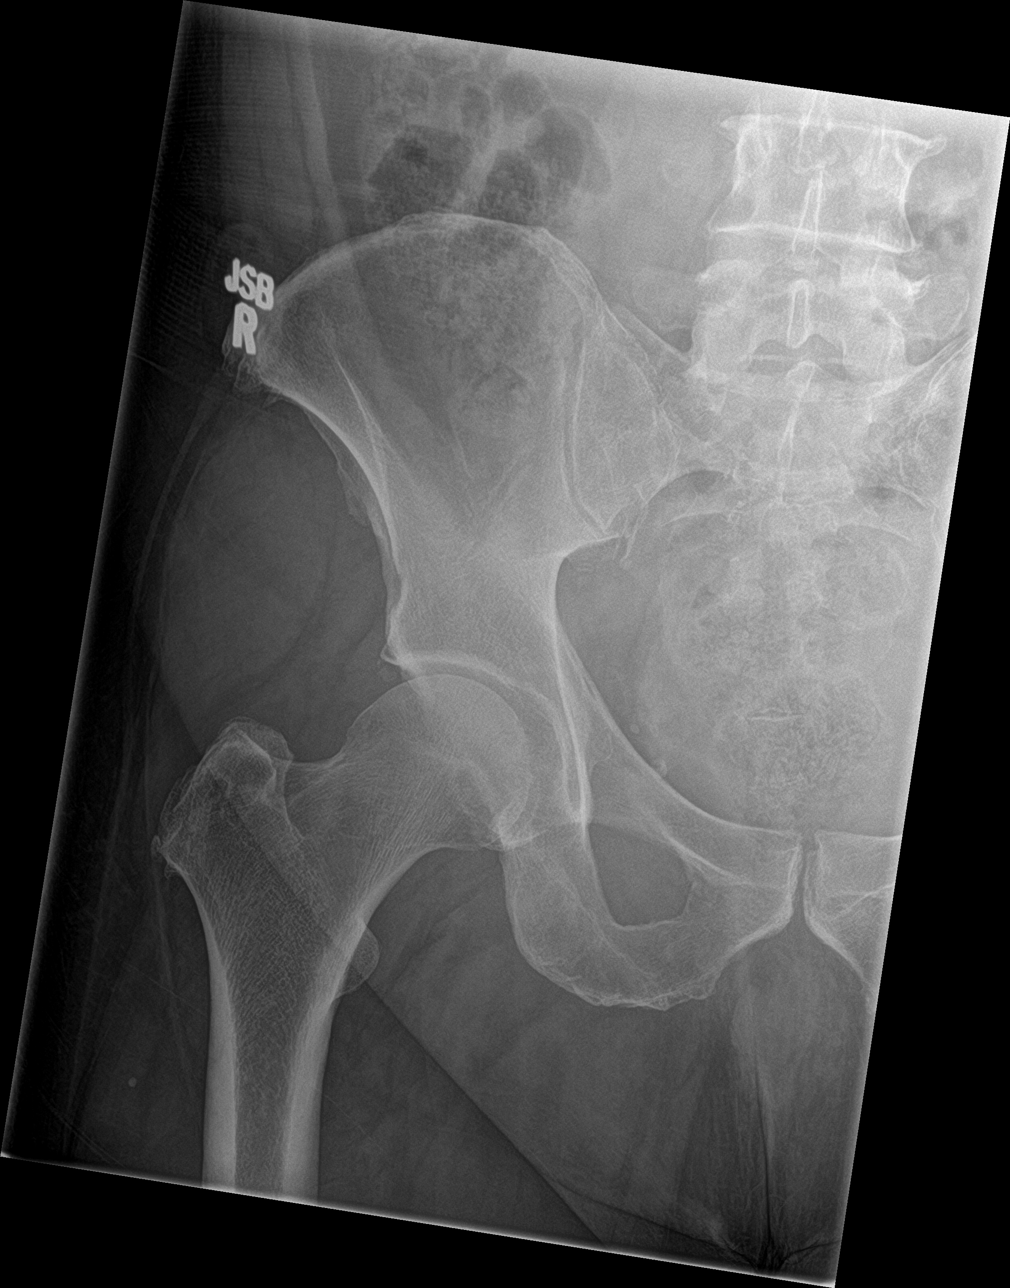

[hip lat]
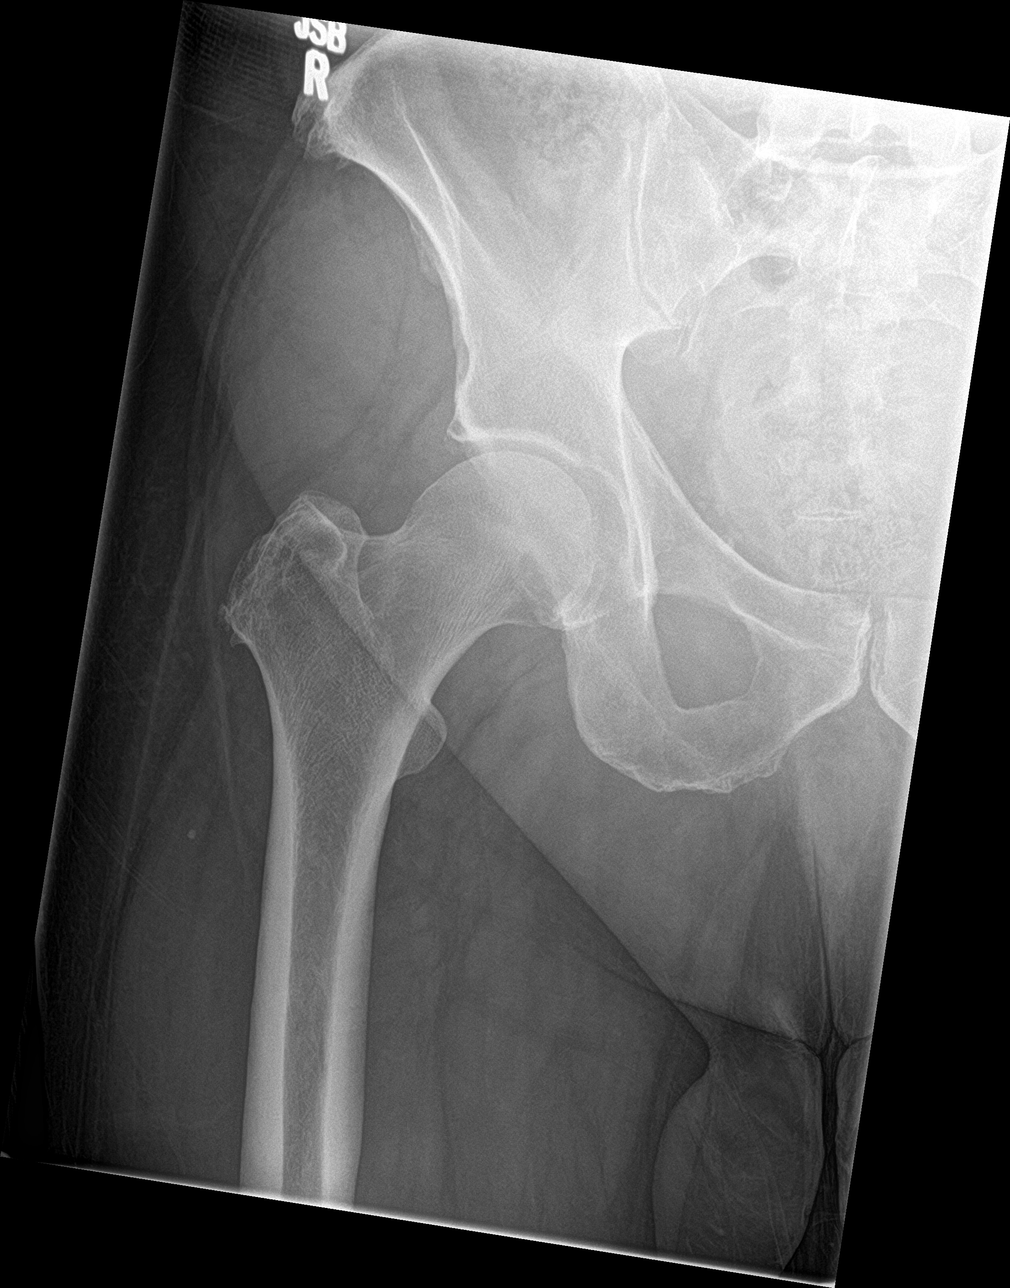

[hip frog leg]
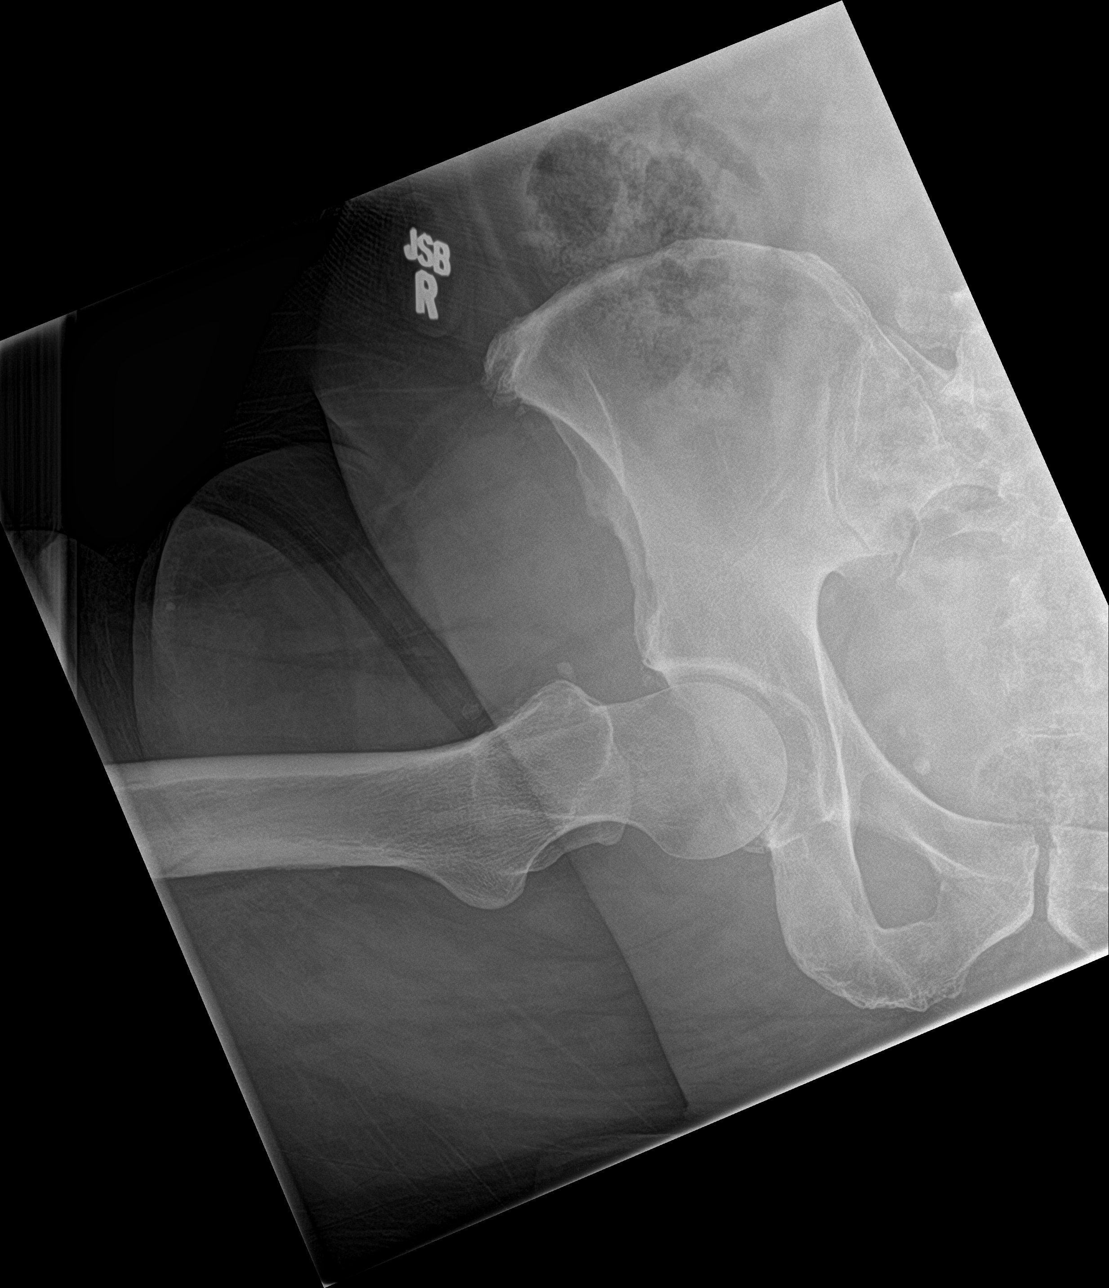

[4 of 4 positions shown; findings below may reference images not displayed]

FINDINGS: The cortical margins of the bony pelvis and right hip are intact. No
fracture. Pubic symphysis and sacroiliac joints are congruent. Both
femoral heads are well-seated in the respective acetabula.
Enthesopathic changes of the iliac crests.
IMPRESSION: No fracture of the pelvis or right hip.
